# Patient Record
Sex: Male | Born: 1935 | Race: Black or African American | Hispanic: No | Marital: Married | Smoking: Former smoker
Health system: Southern US, Community
[De-identification: ages and names within clinical notes are randomized; demographics above are authoritative.]

## PROBLEM LIST (undated history)

## (undated) DIAGNOSIS — H18529 Epithelial (juvenile) corneal dystrophy, unspecified eye: Secondary | ICD-10-CM

## (undated) DIAGNOSIS — I10 Essential (primary) hypertension: Secondary | ICD-10-CM

## (undated) DIAGNOSIS — E119 Type 2 diabetes mellitus without complications: Secondary | ICD-10-CM

## (undated) DIAGNOSIS — K219 Gastro-esophageal reflux disease without esophagitis: Secondary | ICD-10-CM

## (undated) DIAGNOSIS — F419 Anxiety disorder, unspecified: Secondary | ICD-10-CM

---

## 2018-12-15 ENCOUNTER — Emergency Department (HOSPITAL_BASED_OUTPATIENT_CLINIC_OR_DEPARTMENT_OTHER): Payer: Medicare HMO

## 2018-12-15 ENCOUNTER — Other Ambulatory Visit: Payer: Self-pay

## 2018-12-15 ENCOUNTER — Encounter (HOSPITAL_BASED_OUTPATIENT_CLINIC_OR_DEPARTMENT_OTHER): Payer: Self-pay | Admitting: Adult Health

## 2018-12-15 ENCOUNTER — Emergency Department (HOSPITAL_BASED_OUTPATIENT_CLINIC_OR_DEPARTMENT_OTHER)
Admission: EM | Admit: 2018-12-15 | Discharge: 2018-12-15 | Disposition: A | Discharge status: Home / Self Care | Payer: Medicare HMO | Attending: Emergency Medicine | Admitting: Emergency Medicine

## 2018-12-15 DIAGNOSIS — E119 Type 2 diabetes mellitus without complications: Secondary | ICD-10-CM | POA: Diagnosis not present

## 2018-12-15 DIAGNOSIS — Z87891 Personal history of nicotine dependence: Secondary | ICD-10-CM | POA: Insufficient documentation

## 2018-12-15 DIAGNOSIS — I10 Essential (primary) hypertension: Secondary | ICD-10-CM | POA: Diagnosis not present

## 2018-12-15 DIAGNOSIS — M25551 Pain in right hip: Secondary | ICD-10-CM | POA: Diagnosis not present

## 2018-12-15 DIAGNOSIS — Z794 Long term (current) use of insulin: Secondary | ICD-10-CM | POA: Insufficient documentation

## 2018-12-15 DIAGNOSIS — Z79899 Other long term (current) drug therapy: Secondary | ICD-10-CM | POA: Diagnosis not present

## 2018-12-15 HISTORY — DX: Gastro-esophageal reflux disease without esophagitis: K21.9

## 2018-12-15 HISTORY — DX: Type 2 diabetes mellitus without complications: E11.9

## 2018-12-15 HISTORY — DX: Anxiety disorder, unspecified: F41.9

## 2018-12-15 HISTORY — DX: Essential (primary) hypertension: I10

## 2018-12-15 MED ORDER — HYDROCODONE-ACETAMINOPHEN 5-325 MG PO TABS: 1.0000 | ORAL_TABLET | ORAL | 0 refills | Status: DC | PRN

## 2018-12-15 NOTE — ED Triage Notes (Signed)
Johnny Meyer presents with 5 days of right hip pain. HE denies injury and fall. History of left hip arthritis and pain as well. THe pain does not radiate. Acetaminophen at 9 am today is not helping.

## 2018-12-15 NOTE — ED Notes (Signed)
ED Provider at bedside. 

## 2018-12-15 NOTE — ED Provider Notes (Signed)
[facility] EMERGENCY DEPARTMENT Provider Note   CSN: [id number] Arrival date & time: 12/15/18  1313     History   Chief Complaint Chief Complaint  Patient presents with  . Hip Pain    HPI Johnny Meyer is a 84 y.o. male.     Patient is a 84 year old male who presents with right hip pain.  He has a history of arthritis but says it typically bothers him in his left hip.  He has had a 5-day history of pain in his right hip.  He states it does not hurt when he is laying down but hurts when he is walking on it.  He denies any back pain.  No significant knee pain.  No numbness in his leg.  He denies any known injuries.  He has been using Tylenol without improvement in symptoms.     Past Medical History:  Diagnosis Date  . Anxiety   . Diabetes mellitus without complication (HCC)   . GERD (gastroesophageal reflux disease)   . Hypertension     There are no active problems to display for this patient.   History reviewed. No pertinent surgical history.      Home Medications    Prior to Admission medications   Medication Sig Start Date End Date Taking? Authorizing Provider  allopurinol (ZYLOPRIM) 300 MG tablet TAKE 1 TABLETEVERY NIGHT 12/12/18  Yes [provider]  amLODipine (NORVASC) 2.5 MG tablet Take by mouth. 12/03/18  Yes [provider]  aspirin EC 81 MG tablet Take by mouth. 06/17/13  Yes [provider]  atenolol-chlorthalidone (TENORETIC) 50-25 MG tablet Takes 1/2 tablet by mouth daily 01/21/14  Yes [provider]  atorvastatin (LIPITOR) 40 MG tablet TAKE 1/2 TABLET EVERY DAY 01/20/14  Yes [provider]  busPIRone (BUSPAR) 15 MG tablet Take by mouth. 02/15/18  Yes [provider]  Colchicine 0.6 MG CAPS Day 1: 1.2 mg p.o. x1 then 0.6 mg p.o. 1 hour later x1.  Day 2: 0.6 mg once or twice daily until flare resolves to a max of 3 days 03/26/18  Yes [provider]  DULoxetine (CYMBALTA) 20 MG capsule  2 capsules every am for chronic pain 10/24/18  Yes [provider]  fosinopril (MONOPRIL) 40 MG tablet TAKE 1/2 TABLET EVERY DAY 01/23/14  Yes [provider]  gabapentin (NEURONTIN) 600 MG tablet TAKE 1 TABLET THREE TIMES DAILY ( DOSE INCREASE ) 09/28/18  Yes [provider]  insulin glargine (LANTUS) 100 UNIT/ML injection Inject into the skin. 06/17/13  Yes [provider]  metFORMIN (GLUCOPHAGE) 500 MG tablet TAKE 1 TABLET TWICE DAILY WITH MEALS 12/14/17  Yes [provider]  metoprolol succinate (TOPROL-XL) 100 MG 24 hr tablet TAKE 1 TABLET EVERY DAY ( DOSE INCREASE ) 09/28/18  Yes [provider]  omeprazole (PRILOSEC) 40 MG capsule TAKE 1 CAPSULE EVERY MORNING 06/17/13  Yes [provider]  HYDROcodone-acetaminophen (NORCO/VICODIN) 5-325 MG tablet Take 1-2 tablets by mouth every 4 (four) hours as needed. 12/15/18   Rolan BuccoBelfi, Billie Intriago, MD    Family History History reviewed. No pertinent family history.  Social History Social History   Tobacco Use  . Smoking status: Former Games developermoker  . Smokeless tobacco: Never Used  Substance Use Topics  . Alcohol use: Never    Frequency: Never  . Drug use: Never     Allergies   Patient has no known allergies.   Review of Systems Review of Systems  Constitutional: Negative for fever.  Gastrointestinal: Negative for nausea and vomiting.  Musculoskeletal: Positive for arthralgias. Negative for back pain, joint swelling and neck pain.  Skin: Negative for wound.  Neurological: Negative for weakness, numbness and headaches.     Physical Exam Updated Vital Signs BP (!) 154/100   Pulse 71   Temp 98.2 F (36.8 C) (Oral)   Resp 18   SpO2 99%   Physical Exam Constitutional:      Appearance: He is well-developed.  HENT:     Head: Normocephalic and atraumatic.  Neck:     Musculoskeletal: Normal range of motion and neck supple.  Cardiovascular:     Rate and Rhythm: Normal rate.   Pulmonary:     Effort: Pulmonary effort is normal.  Musculoskeletal:        General: No tenderness.     Comments: Patient has no tenderness on palpation of his right hip,.  No pain on range of motion of the hip.  No pain over the lateral bursa.  No pain along the lumbar spine or lower back.  No pain over the SI joint.  There is no pain on palpation or range of motion of the right knee.  No significant swelling to the legs although he has some baseline swelling of both legs which he says is unchanged from baseline.  Pedal pulses are intact by Doppler.  He has normal color and temperature to the feet.  He has normal sensation and motor function distally.  Skin:    General: Skin is warm and dry.  Neurological:     Mental Status: He is alert and oriented to person, place, and time.      ED Treatments / Results  Labs (all labs ordered are listed, but only abnormal results are displayed) Labs Reviewed - No data to display  EKG    Radiology Dg Hip Unilat W Or Wo Pelvis 2-3 Views Right  Result Date: 12/15/2018 CLINICAL DATA:  Right hip pain.  No known injury. EXAM: DG HIP (WITH OR WITHOUT PELVIS) 2-3V RIGHT COMPARISON:  None. FINDINGS: There is no fracture or dislocation. There are slight arthritic changes of the right hip joint. Similar but slightly less prominent findings on the left. Prominent enthesophytes at the iliac spines as well as soft tissue calcifications adjacent to the greater trochanters bilaterally. Findings are suggestive of diffuse idiopathic skeletal hyperostosis(DISH). IMPRESSION: Slight arthritic changes of the right hip.  No acute abnormalities. Electronically Signed   By: Francene BoyersJames  Maxwell M.D.   On: 12/15/2018 14:06    Procedures Procedures (including critical care time)  Medications Ordered in ED Medications - No data to display   Initial Impression / Assessment and Plan / ED Course  I have reviewed the triage vital signs and the nursing notes.  Pertinent labs &  imaging results that were available during my care of the patient were reviewed by me and considered in my medical decision making (see chart for details).        X-ray shows some mild arthritic changes.  Otherwise unremarkable.  He is able to ambulate.  He has no significant pain on range of motion of the joint.  I feel this is likely a flareup of his arthritis.  There is no specific tenderness over the bursa.  He was discharged home in good condition.  He was given a short course of Vicodin which he has used in the past.  He will follow-up with his PCP next week if his symptoms are not improving.  Final Clinical  Impressions(s) / ED Diagnoses   Final diagnoses:  Right hip pain    ED Discharge Orders         Ordered    HYDROcodone-acetaminophen (NORCO/VICODIN) 5-325 MG tablet  Every 4 hours PRN     12/15/18 1529           Malvin Johns, MD 12/15/18 1532

## 2021-01-14 IMAGING — CR DG HIP (WITH OR WITHOUT PELVIS) 2-3V RIGHT
3 series · 3 of 3 positions shown · non-contrast
Comparison: None.

CLINICAL DATA: Right hip pain.  No known injury.

EXAM:
DG HIP (WITH OR WITHOUT PELVIS) 2-3V RIGHT

[t pelvis a.p.]
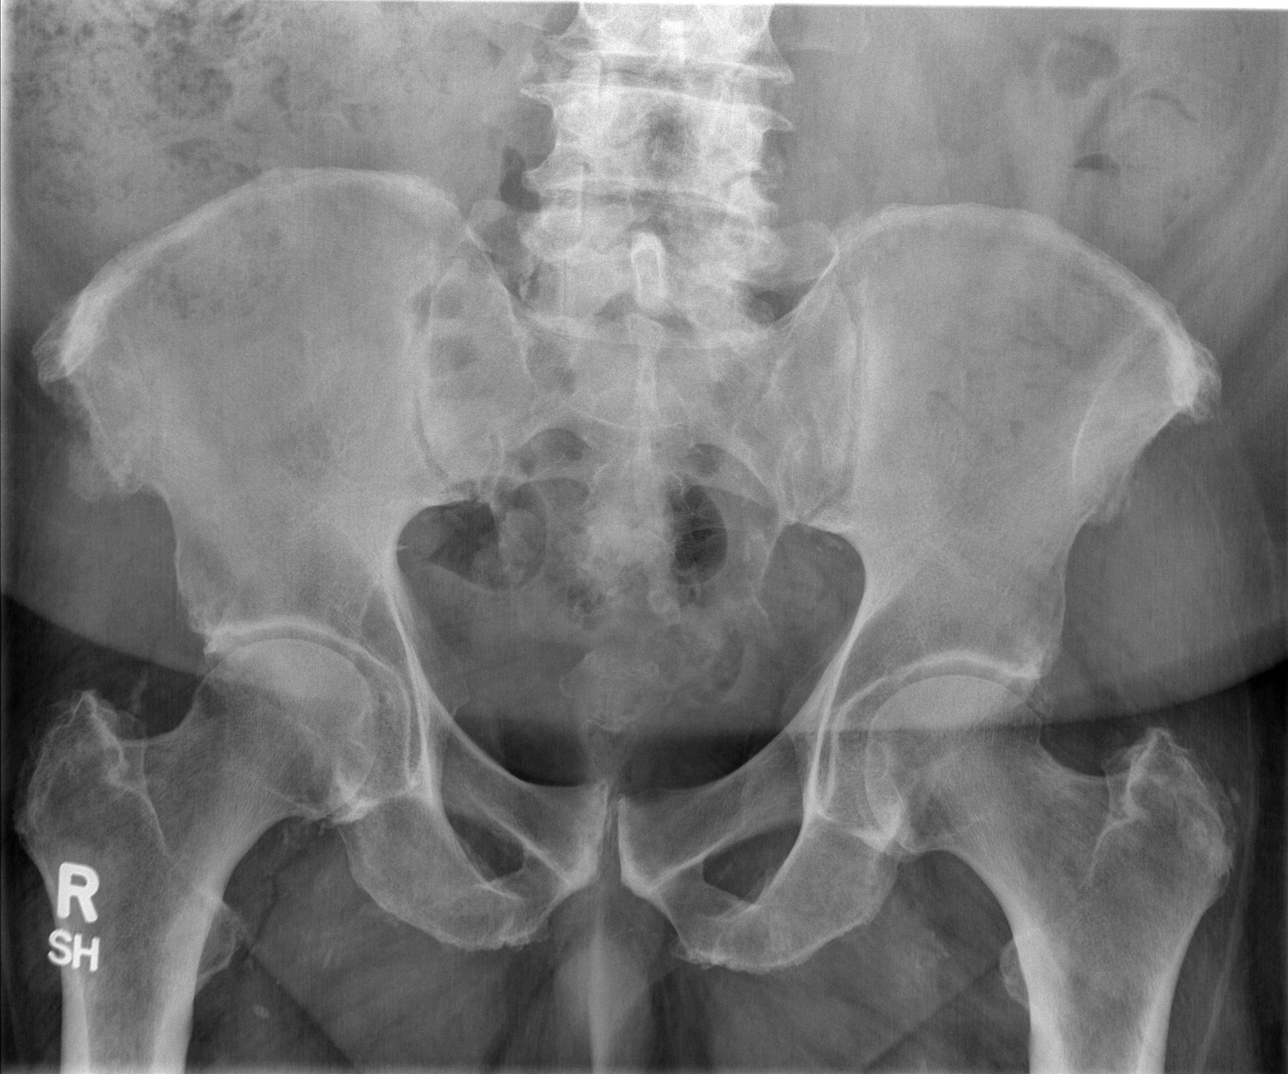

[t hip ap right]
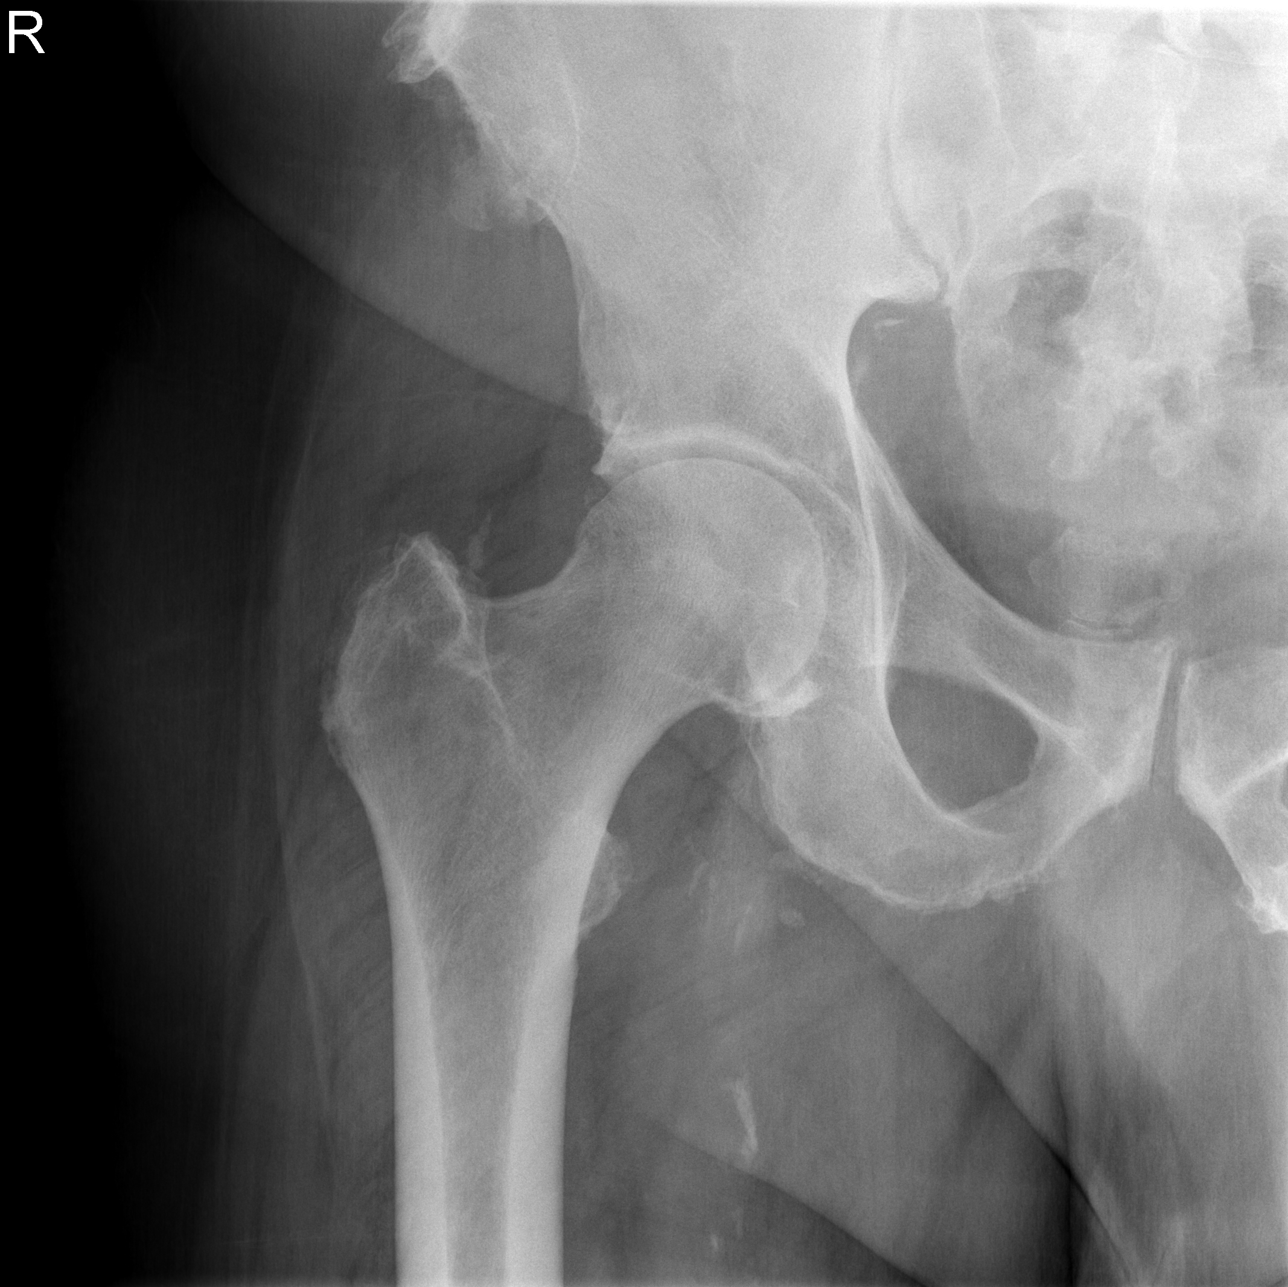

[t hip frog leg right]
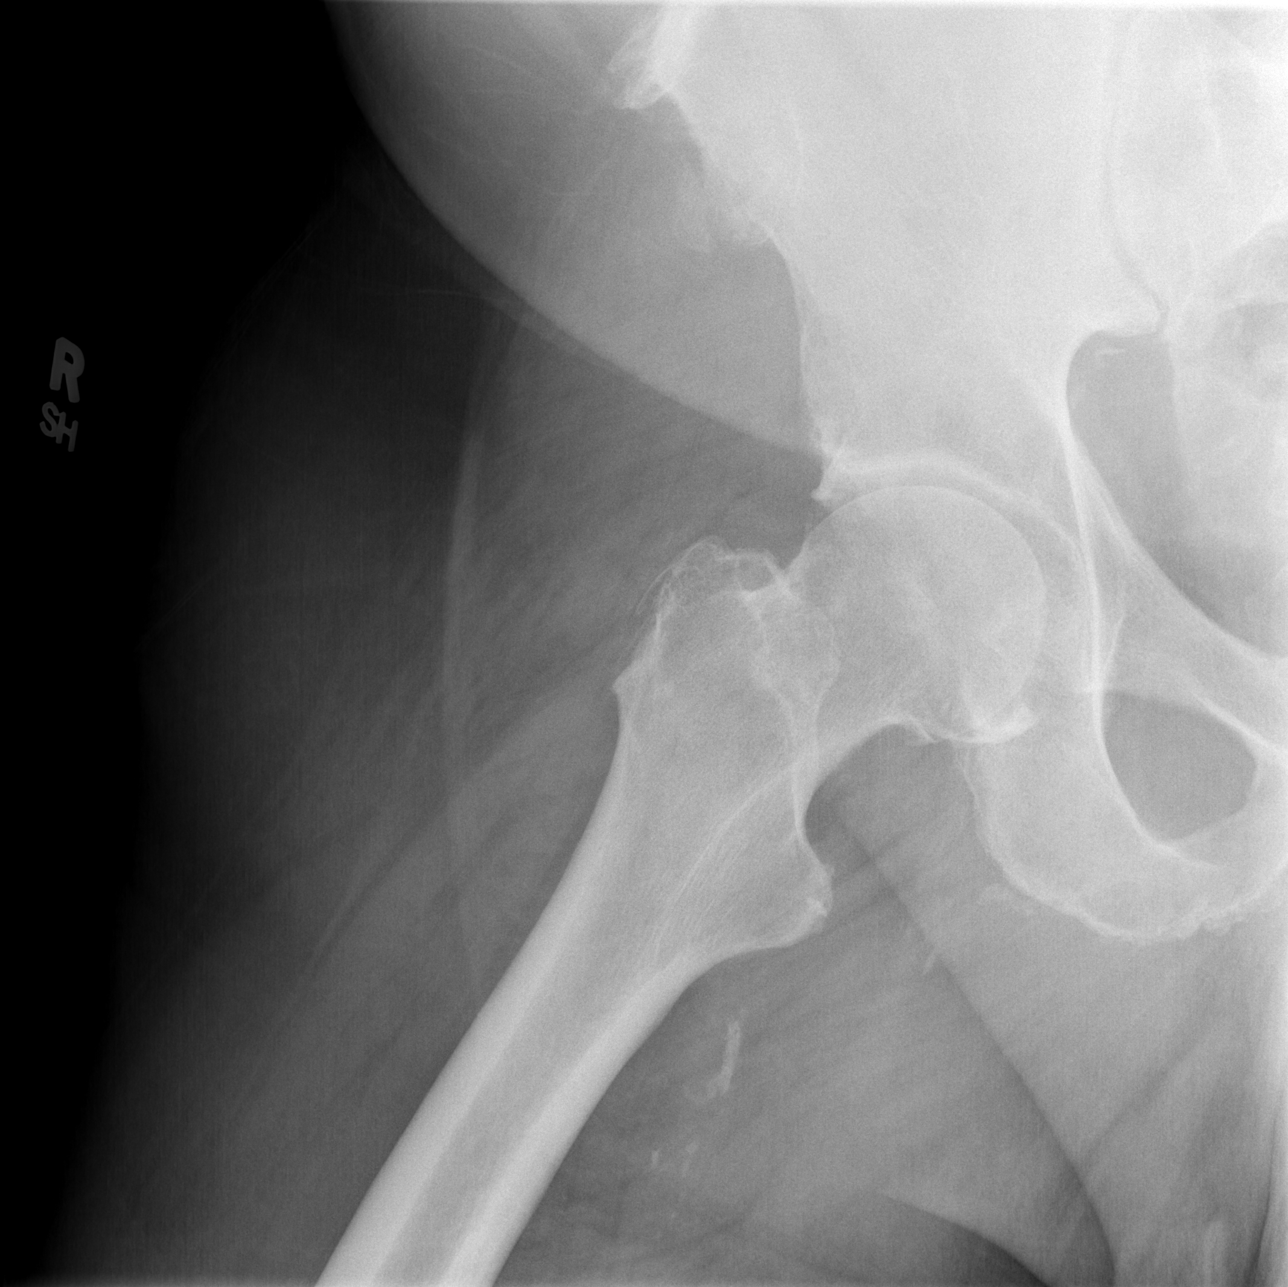

[3 of 3 positions shown; findings below may reference images not displayed]

FINDINGS: There is no fracture or dislocation. There are slight arthritic
changes of the right hip joint. Similar but slightly less prominent
findings on the left. Prominent enthesophytes at the iliac spines as
well as soft tissue calcifications adjacent to the greater
trochanters bilaterally. Findings are suggestive of diffuse
idiopathic skeletal hyperostosis(DISH).
IMPRESSION: Slight arthritic changes of the right hip.  No acute abnormalities.

## 2021-06-04 ENCOUNTER — Emergency Department (HOSPITAL_BASED_OUTPATIENT_CLINIC_OR_DEPARTMENT_OTHER): Payer: Medicare HMO

## 2021-06-04 ENCOUNTER — Emergency Department (HOSPITAL_COMMUNITY): Payer: Medicare HMO

## 2021-06-04 ENCOUNTER — Observation Stay (HOSPITAL_BASED_OUTPATIENT_CLINIC_OR_DEPARTMENT_OTHER)
Admission: EM | Admit: 2021-06-04 | Discharge: 2021-06-06 | Disposition: A | Discharge status: Home / Self Care | Payer: Medicare HMO | Attending: Emergency Medicine | Admitting: Emergency Medicine

## 2021-06-04 ENCOUNTER — Other Ambulatory Visit: Payer: Self-pay

## 2021-06-04 ENCOUNTER — Encounter (HOSPITAL_BASED_OUTPATIENT_CLINIC_OR_DEPARTMENT_OTHER): Payer: Self-pay

## 2021-06-04 DIAGNOSIS — E871 Hypo-osmolality and hyponatremia: Secondary | ICD-10-CM | POA: Insufficient documentation

## 2021-06-04 DIAGNOSIS — Z79899 Other long term (current) drug therapy: Secondary | ICD-10-CM | POA: Insufficient documentation

## 2021-06-04 DIAGNOSIS — Z7982 Long term (current) use of aspirin: Secondary | ICD-10-CM | POA: Insufficient documentation

## 2021-06-04 DIAGNOSIS — R0902 Hypoxemia: Secondary | ICD-10-CM

## 2021-06-04 DIAGNOSIS — Z87891 Personal history of nicotine dependence: Secondary | ICD-10-CM | POA: Diagnosis not present

## 2021-06-04 DIAGNOSIS — R4701 Aphasia: Principal | ICD-10-CM | POA: Diagnosis present

## 2021-06-04 DIAGNOSIS — Y900 Blood alcohol level of less than 20 mg/100 ml: Secondary | ICD-10-CM | POA: Diagnosis not present

## 2021-06-04 DIAGNOSIS — E119 Type 2 diabetes mellitus without complications: Secondary | ICD-10-CM

## 2021-06-04 DIAGNOSIS — D649 Anemia, unspecified: Secondary | ICD-10-CM | POA: Diagnosis not present

## 2021-06-04 DIAGNOSIS — R569 Unspecified convulsions: Secondary | ICD-10-CM

## 2021-06-04 DIAGNOSIS — Z794 Long term (current) use of insulin: Secondary | ICD-10-CM | POA: Diagnosis not present

## 2021-06-04 DIAGNOSIS — Z20822 Contact with and (suspected) exposure to covid-19: Secondary | ICD-10-CM | POA: Insufficient documentation

## 2021-06-04 DIAGNOSIS — I10 Essential (primary) hypertension: Secondary | ICD-10-CM | POA: Diagnosis present

## 2021-06-04 DIAGNOSIS — Z7984 Long term (current) use of oral hypoglycemic drugs: Secondary | ICD-10-CM | POA: Insufficient documentation

## 2021-06-04 DIAGNOSIS — E785 Hyperlipidemia, unspecified: Secondary | ICD-10-CM | POA: Diagnosis not present

## 2021-06-04 DIAGNOSIS — G9389 Other specified disorders of brain: Secondary | ICD-10-CM | POA: Insufficient documentation

## 2021-06-04 DIAGNOSIS — R2681 Unsteadiness on feet: Secondary | ICD-10-CM | POA: Diagnosis not present

## 2021-06-04 DIAGNOSIS — R4182 Altered mental status, unspecified: Secondary | ICD-10-CM | POA: Diagnosis present

## 2021-06-04 HISTORY — DX: Epithelial (juvenile) corneal dystrophy, unspecified eye: H18.529

## 2021-06-04 LAB — ETHANOL: Alcohol, Ethyl (B): <10 mg/dL (ref <10)

## 2021-06-04 LAB — COMPREHENSIVE METABOLIC PANEL
ALT: 11 U/L (ref 0–44)
AST: 15 U/L (ref 15–41)
Albumin: 3.6 g/dL (ref 3.5–5.0)
Alkaline Phosphatase: 92 U/L (ref 38–126)
Anion gap: 10 (ref 5–15)
BUN: 14 mg/dL (ref 8–23)
CO2: 21 mmol/L — ABNORMAL LOW (ref 22–32)
Calcium: 8.3 mg/dL — ABNORMAL LOW (ref 8.9–10.3)
Chloride: 103 mmol/L (ref 98–111)
Creatinine, Ser: 1.12 mg/dL (ref 0.61–1.24)
GFR, Estimated: >60 mL/min (ref >60)
Glucose, Bld: 217 mg/dL — ABNORMAL HIGH (ref 70–99)
Potassium: 4.4 mmol/L (ref 3.5–5.1)
Sodium: 134 mmol/L — ABNORMAL LOW (ref 135–145)
Total Bilirubin: 0.5 mg/dL (ref 0.3–1.2)
Total Protein: 6.9 g/dL (ref 6.5–8.1)

## 2021-06-04 LAB — CBC
HCT: 38 % — ABNORMAL LOW (ref 39.0–52.0)
Hemoglobin: 12.5 g/dL — ABNORMAL LOW (ref 13.0–17.0)
MCH: 29.3 pg (ref 26.0–34.0)
MCHC: 32.9 g/dL (ref 30.0–36.0)
MCV: 89.2 fL (ref 80.0–100.0)
Platelets: 200 10*3/uL (ref 150–400)
RBC: 4.26 MIL/uL (ref 4.22–5.81)
RDW: 14.6 % (ref 11.5–15.5)
WBC: 8.7 10*3/uL (ref 4.0–10.5)
nRBC: 0 % (ref 0.0–0.2)

## 2021-06-04 LAB — RESP PANEL BY RT-PCR (FLU A&B, COVID) ARPGX2
Influenza A by PCR: NEGATIVE
Influenza B by PCR: NEGATIVE
SARS Coronavirus 2 by RT PCR: NEGATIVE

## 2021-06-04 LAB — PROTIME-INR
INR: 1 (ref 0.8–1.2)
Prothrombin Time: 13.1 seconds (ref 11.4–15.2)

## 2021-06-04 LAB — DIFFERENTIAL
Abs Immature Granulocytes: 0.01 10*3/uL (ref 0.00–0.07)
Basophils Absolute: 0.1 10*3/uL (ref 0.0–0.1)
Basophils Relative: 1 %
Eosinophils Absolute: 0.1 10*3/uL (ref 0.0–0.5)
Eosinophils Relative: 1 %
Immature Granulocytes: 0 %
Lymphocytes Relative: 32 %
Lymphs Abs: 2.8 10*3/uL (ref 0.7–4.0)
Monocytes Absolute: 0.7 10*3/uL (ref 0.1–1.0)
Monocytes Relative: 9 %
Neutro Abs: 5 10*3/uL (ref 1.7–7.7)
Neutrophils Relative %: 57 %

## 2021-06-04 LAB — CBG MONITORING, ED: Glucose-Capillary: 213 mg/dL — ABNORMAL HIGH (ref 70–99)

## 2021-06-04 LAB — APTT: aPTT: 31 seconds (ref 24–36)

## 2021-06-04 MED ORDER — IOHEXOL 350 MG/ML SOLN
100.0000 mL | Freq: Once | INTRAVENOUS | Status: AC | PRN
  Administered 2021-06-04: 100 mL via INTRAVENOUS

## 2021-06-04 MED ORDER — LEVETIRACETAM IN NACL 1000 MG/100ML IV SOLN
1000.0000 mg | Freq: Once | INTRAVENOUS | Status: AC
  Administered 2021-06-04: 1000 mg via INTRAVENOUS
  Filled 2021-06-04: qty 100

## 2021-06-04 NOTE — ED Triage Notes (Addendum)
Per son pt with AMS and unable to complete sentences-sx started yesterday-unknown time-pt to tx area-steady gait with own gait-EDP at [facility]

## 2021-06-04 NOTE — ED Notes (Signed)
Pt ambulatory to room from triage with shuffling gait, pts son reports pt is supposed to use a walker and normally shuffles his feet while walking.

## 2021-06-04 NOTE — ED Notes (Signed)
[facility] at bedside 

## 2021-06-04 NOTE — ED Provider Notes (Signed)
[facility] EMERGENCY DEPARTMENT Provider Note   CSN: [id number] Arrival date & time: 06/04/21  1840     History Chief Complaint  Patient presents with   Altered Mental Status    Johnny Meyer is a 86 y.o. male.  Last known normal was 5 PM yesterday.  Patient has been having issues with talking.  He is not answering questions appropriately.  No obvious weakness per family.  Patient denies any chest pain, headache.  The history is provided by the patient and a caregiver.  Neurologic Problem This is a new problem. The current episode started yesterday. The problem occurs constantly. The problem has not changed since onset.Pertinent negatives include no chest pain, no abdominal pain, no headaches and no shortness of breath. Nothing aggravates the symptoms. Nothing relieves the symptoms. He has tried nothing for the symptoms. The treatment provided no relief.      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   No past surgical history on file.     No family history on file.  Social History   Tobacco Use   Smoking status: Former   Smokeless tobacco: Never  Building services engineer Use: Never used  Substance Use Topics   Alcohol use: Never   Drug use: Never    Home Medications Prior to Admission medications   Medication Sig Start Date End Date Taking? Authorizing Provider  allopurinol (ZYLOPRIM) 300 MG tablet TAKE 1 TABLETEVERY NIGHT 12/12/18   [provider]  amLODipine (NORVASC) 2.5 MG tablet Take by mouth. 12/03/18   [provider]  aspirin EC 81 MG tablet Take by mouth. 06/17/13   [provider]  atenolol-chlorthalidone (TENORETIC) 50-25 MG tablet Takes 1/2 tablet by mouth daily 01/21/14   [provider]  atorvastatin (LIPITOR) 40 MG tablet TAKE 1/2 TABLET EVERY DAY 01/20/14   [provider]  busPIRone (BUSPAR) 15 MG tablet Take by mouth. 02/15/18   [provider]  Colchicine 0.6 MG CAPS Day 1: 1.2 mg p.o. x1 then 0.6 mg p.o. 1 hour later x1.  Day 2: 0.6 mg once or twice daily until flare resolves to a max of 3 days 03/26/18   [provider]  DULoxetine (CYMBALTA) 20 MG capsule 2 capsules every am for chronic pain 10/24/18   [provider]  fosinopril (MONOPRIL) 40 MG tablet TAKE 1/2 TABLET EVERY DAY 01/23/14   [provider]  gabapentin (NEURONTIN) 600 MG tablet TAKE 1 TABLET THREE TIMES DAILY ( DOSE INCREASE ) 09/28/18   [provider]  HYDROcodone-acetaminophen (NORCO/VICODIN) 5-325 MG tablet Take 1-2 tablets by mouth every 4 (four) hours as needed. 12/15/18   Rolan Bucco, MD  insulin glargine (LANTUS) 100 UNIT/ML injection Inject into the skin. 06/17/13   [provider]  metFORMIN (GLUCOPHAGE) 500 MG tablet TAKE 1 TABLET TWICE DAILY WITH MEALS 12/14/17   [provider]  metoprolol succinate (TOPROL-XL) 100 MG 24 hr tablet TAKE 1 TABLET EVERY DAY ( DOSE INCREASE ) 09/28/18   [provider]  omeprazole (PRILOSEC) 40 MG capsule TAKE 1 CAPSULE EVERY MORNING 06/17/13   [provider]    Allergies    Patient has no known allergies.  Review of Systems  Review of Systems  Constitutional:  Negative for chills and fever.  HENT:  Negative for ear pain and sore throat.   Eyes:  Negative for pain and visual disturbance.  Respiratory:  Negative for cough and shortness of breath.   Cardiovascular:  Negative for chest pain and palpitations.  Gastrointestinal:  Negative for abdominal pain and vomiting.  Genitourinary:  Negative for dysuria and hematuria.  Musculoskeletal:  Negative for arthralgias and back pain.  Skin:  Negative for color change and rash.  Neurological:  Positive for speech difficulty.  Negative for dizziness, tremors, seizures, syncope, facial asymmetry, weakness, light-headedness, numbness and headaches.  All other systems reviewed and are negative.  Physical Exam Updated Vital Signs BP (!) 170/84    Pulse 64    Temp 97.9 F (36.6 C) (Oral)    Resp (!) 21    SpO2 96%   Physical Exam Vitals and nursing note reviewed.  Constitutional:      General: He is not in acute distress.    Appearance: He is well-developed. He is not ill-appearing.  HENT:     Head: Normocephalic and atraumatic.     Right Ear: Tympanic membrane normal.     Left Ear: Tympanic membrane normal.     Nose: Nose normal.     Mouth/Throat:     Mouth: Mucous membranes are moist.  Eyes:     Extraocular Movements: Extraocular movements intact.     Conjunctiva/sclera: Conjunctivae normal.     Pupils: Pupils are equal, round, and reactive to light.  Cardiovascular:     Rate and Rhythm: Normal rate and regular rhythm.     Pulses: Normal pulses.     Heart sounds: Normal heart sounds. No murmur heard. Pulmonary:     Effort: Pulmonary effort is normal. No respiratory distress.     Breath sounds: Normal breath sounds.  Abdominal:     Palpations: Abdomen is soft.     Tenderness: There is no abdominal tenderness.  Musculoskeletal:        General: No swelling.     Cervical back: Normal range of motion and neck supple.  Skin:    General: Skin is warm and dry.     Capillary Refill: Capillary refill takes less than 2 seconds.  Neurological:     Mental Status: He is alert.     Cranial Nerves: No cranial nerve deficit.     Sensory: No sensory deficit.     Motor: No weakness.     Coordination: Coordination normal.     Comments: Patient appears to have expressive aphasia, difficulty following commands but appears a 5+ out of 5 strength throughout, normal sensation, no obvious neglect, no slurred speech or facial droop, cranial nerves appear to be intact, normal finger-to-nose finger but needs to be well  coached, answers with inappropriate words when asking him to identify objects in the room  Psychiatric:        Mood and Affect: Mood normal.    ED Results / Procedures / Treatments   Labs (all labs ordered are listed, but only abnormal results are displayed) Labs Reviewed  CBC - Abnormal; Notable for the following components:      Result Value   Hemoglobin 12.5 (*)    HCT 38.0 (*)    All other components within normal limits  COMPREHENSIVE METABOLIC PANEL - Abnormal; Notable for the following components:   Sodium 134 (*)    CO2 21 (*)    Glucose, Bld 217 (*)  Calcium 8.3 (*)    All other components within normal limits  CBG MONITORING, ED - Abnormal; Notable for the following components:   Glucose-Capillary 213 (*)    All other components within normal limits  RESP PANEL BY RT-PCR (FLU A&B, COVID) ARPGX2  ETHANOL  PROTIME-INR  APTT  DIFFERENTIAL  RAPID URINE DRUG SCREEN, HOSP PERFORMED  URINALYSIS, ROUTINE W REFLEX MICROSCOPIC    EKG EKG Interpretation  Date/Time:  Friday June 04 2021 18:47:40 EST Ventricular Rate:  65 PR Interval:  168 QRS Duration: 115 QT Interval:  426 QTC Calculation: 443 R Axis:   20 Text Interpretation: Sinus rhythm Nonspecific intraventricular conduction delay Confirmed by Lennice Sites ([id number]) on 06/04/2021 6:59:04 PM  Radiology CT Angio Head W or Wo Contrast  Result Date: 06/04/2021 CLINICAL DATA:  History of AVM. Acute neuro deficit. Rule out stroke. Aphasia. EXAM: CT ANGIOGRAPHY HEAD AND NECK TECHNIQUE: Multidetector CT imaging of the head and neck was performed using the standard protocol during bolus administration of intravenous contrast. Multiplanar CT image reconstructions and MIPs were obtained to evaluate the vascular anatomy. Carotid stenosis measurements (when applicable) are obtained utilizing NASCET criteria, using the distal internal carotid diameter as the denominator. CONTRAST:  138mL OMNIPAQUE IOHEXOL 350 MG/ML SOLN  COMPARISON:  MRI head with contrast 12/09/2020 FINDINGS: CT HEAD FINDINGS Brain: Hypodensity lateral to the left frontal horn at the site of prior AVM. There is encephalomalacia in this area with a 5 mm calcification. No acute hemorrhage. Generalized atrophy. Negative for hydrocephalus. Negative for acute hemorrhage or mass. Vascular: Negative for hyperdense vessel Skull: Negative Sinuses: Paranasal sinuses clear. Orbits: No orbital lesion.  Right cataract extraction Review of the MIP images confirms the above findings CTA NECK FINDINGS Aortic arch: Standard branching. Imaged portion shows no evidence of aneurysm or dissection. No significant stenosis of the major arch vessel origins. Right carotid system: Minimal atherosclerotic disease right carotid bifurcation. Negative for stenosis. Left carotid system: Minimal atherosclerotic disease left carotid bifurcation. Negative for stenosis Vertebral arteries: Both vertebral arteries are widely patent to the skull base. Atherosclerotic calcification and severe stenosis of the vertebral artery at the foramen magnum bilaterally. Skeleton: Cervical spondylosis.  Multilevel spinal stenosis. Other neck: Negative Upper chest: Negative Review of the MIP images confirms the above findings CTA HEAD FINDINGS Anterior circulation: Atherosclerotic calcification cavernous carotid bilaterally without stenosis. Anterior cerebral arteries widely patent. Right middle cerebral artery widely patent Left M1 segment patent. There are multiple areas of severe stenosis involving the left MCA bifurcation and left M2 branches. No enhancing AVM. On the prior MRI, the enhancing AVM was present in the anterior sylvian fissure on the left. No enlarged draining vein. Posterior circulation: Severe calcific stenosis of both vertebral arteries at the foramen magnum. Both vertebral arteries are then patent to the basilar. Basilar is patent. Superior cerebellar and posterior cerebral arteries patent  bilaterally. Mild atherosclerotic disease in the posterior cerebral arteries bilaterally. No vascular malformation. Venous sinuses: Normal venous enhancement. Anatomic variants: None Review of the MIP images confirms the above findings IMPRESSION: 1. No significant carotid stenosis in the neck 2. 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 Previously noted AVM in the left frontal lobe in the anterior sylvian fissure does not show vascular enhancement. Question interval treatment with radiation. There is considerable vascular stenosis in the left MCA bifurcation and left MCA branches which could be due to radiation. 4. CT head demonstrates no acute hemorrhage. Encephalomalacia in the left  frontal lobe. Electronically Signed   By: Franchot Gallo M.D.   On: 06/04/2021 20:17   CT Angio Neck W and/or Wo Contrast  Result Date: 06/04/2021 CLINICAL DATA:  History of AVM. Acute neuro deficit. Rule out stroke. Aphasia. EXAM: CT ANGIOGRAPHY HEAD AND NECK TECHNIQUE: Multidetector CT imaging of the head and neck was performed using the standard protocol during bolus administration of intravenous contrast. Multiplanar CT image reconstructions and MIPs were obtained to evaluate the vascular anatomy. Carotid stenosis measurements (when applicable) are obtained utilizing NASCET criteria, using the distal internal carotid diameter as the denominator. CONTRAST:  174mL OMNIPAQUE IOHEXOL 350 MG/ML SOLN COMPARISON:  MRI head with contrast 12/09/2020 FINDINGS: CT HEAD FINDINGS Brain: Hypodensity lateral to the left frontal horn at the site of prior AVM. There is encephalomalacia in this area with a 5 mm calcification. No acute hemorrhage. Generalized atrophy. Negative for hydrocephalus. Negative for acute hemorrhage or mass. Vascular: Negative for hyperdense vessel Skull: Negative Sinuses: Paranasal sinuses clear. Orbits: No orbital lesion.  Right cataract extraction Review of  the MIP images confirms the above findings CTA NECK FINDINGS Aortic arch: Standard branching. Imaged portion shows no evidence of aneurysm or dissection. No significant stenosis of the major arch vessel origins. Right carotid system: Minimal atherosclerotic disease right carotid bifurcation. Negative for stenosis. Left carotid system: Minimal atherosclerotic disease left carotid bifurcation. Negative for stenosis Vertebral arteries: Both vertebral arteries are widely patent to the skull base. Atherosclerotic calcification and severe stenosis of the vertebral artery at the foramen magnum bilaterally. Skeleton: Cervical spondylosis.  Multilevel spinal stenosis. Other neck: Negative Upper chest: Negative Review of the MIP images confirms the above findings CTA HEAD FINDINGS Anterior circulation: Atherosclerotic calcification cavernous carotid bilaterally without stenosis. Anterior cerebral arteries widely patent. Right middle cerebral artery widely patent Left M1 segment patent. There are multiple areas of severe stenosis involving the left MCA bifurcation and left M2 branches. No enhancing AVM. On the prior MRI, the enhancing AVM was present in the anterior sylvian fissure on the left. No enlarged draining vein. Posterior circulation: Severe calcific stenosis of both vertebral arteries at the foramen magnum. Both vertebral arteries are then patent to the basilar. Basilar is patent. Superior cerebellar and posterior cerebral arteries patent bilaterally. Mild atherosclerotic disease in the posterior cerebral arteries bilaterally. No vascular malformation. Venous sinuses: Normal venous enhancement. Anatomic variants: None Review of the MIP images confirms the above findings IMPRESSION: 1. No significant carotid stenosis in the neck 2. 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 Previously noted AVM in the left frontal lobe in the anterior sylvian fissure does not  show vascular enhancement. Question interval treatment with radiation. There is considerable vascular stenosis in the left MCA bifurcation and left MCA branches which could be due to radiation. 4. CT head demonstrates no acute hemorrhage. Encephalomalacia in the left frontal lobe. Electronically Signed   By: Franchot Gallo M.D.   On: 06/04/2021 20:17    Procedures Procedures   Medications Ordered in ED Medications  iohexol (OMNIPAQUE) 350 MG/ML injection 100 mL (100 mLs Intravenous Contrast Given 06/04/21 1933)    ED Course  I have reviewed the triage vital signs and the nursing notes.  Pertinent labs & imaging results that were available during my care of the patient were reviewed by me and considered in my medical decision making (see chart for details).    MDM Rules/Calculators/A&P  Clee Comar is an 86 year old male with history of hypertension, diabetes who presents the ED with difficulty with speech.  Patient with overall unremarkable vitals.  No fever.  He is mildly hypertensive at 173/83.  Patient last seen normal at 5 PM yesterday.  Family noticed he is not making much sense when talking.  He appears to have normal strength and sensation on exam.  Overall slightly difficult for him to follow commands.  He has no obvious facial droop or slurred speech.  He has normal visual fields.  Normal cranial nerves.  He appears to have good finger-to-nose finger but slightly delayed likely secondary to confusion with commands.  He appears to have an expressive aphasia.  When I asked him to identify objects in the room he does not say a normal word.  Overall I am concerned for stroke.  We will get a head CT and basic labs.  Does not appear to have a history of stroke.  Family member not quite sure all of his medical problems or history.  He thought that he might have a history of AVMs in the past.  He is not on a blood thinner per chart review.  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  He is followed with neurosurgery at [facility].  Recent imaging has showed decreased size of this AVM.  CTA of the head and neck is overall unremarkable.  Lab work with no significant anemia, electrolyte ab, kidney injury.  Talked with Dr. Lelan Pons with neurology who is aware of the patient being transferred to [facility] for MRI.  He should be contacted after MRI is done.  Possible that these could be atypical seizures.  Dr. Tyrone Nine also aware with the ED staff.  This chart was dictated using voice recognition software.  Despite best efforts to proofread,  errors can occur which can change the documentation meaning.      Final Clinical Impression(s) / ED Diagnoses Final diagnoses:  Aphasia    Rx / DC Orders ED Discharge Orders     None        Lennice Sites, DO 06/04/21 2027

## 2021-06-04 NOTE — ED Notes (Signed)
After passing swallow eval, pt provided with water, chips and trail mix. Pt able to feed self and swallow without issue.

## 2021-06-04 NOTE — ED Notes (Signed)
Patient arrived from [facility].  No current complaints at this time.  Understands plan of care.

## 2021-06-04 NOTE — ED Notes (Signed)
Patient taken to MRI

## 2021-06-04 NOTE — ED Notes (Signed)
Pt transported to CT ?

## 2021-06-04 NOTE — ED Provider Notes (Signed)
Patient with a history of prior AVM presents with aphasia.  He was initially seen at [facility] and transferred here for MRI.  He had a CTA of the head and neck which showed no concerning findings.  MRI done tonight shows no acute abnormalities.  I spoke with the neurologist on-call, Dr. Scherrie November who recommends admission and EEG.  He is concerned that maybe this aphasia is resulting from seizure activity.  He does recommend loading the patient with Keppra and then starting 500 twice daily.  Will consult the hospitalist for admission.   Rolan Bucco, MD 06/04/21 [phone]

## 2021-06-05 ENCOUNTER — Encounter (HOSPITAL_COMMUNITY): Payer: Self-pay | Admitting: Family Medicine

## 2021-06-05 ENCOUNTER — Observation Stay (HOSPITAL_COMMUNITY): Payer: Medicare HMO

## 2021-06-05 DIAGNOSIS — Z794 Long term (current) use of insulin: Secondary | ICD-10-CM | POA: Diagnosis not present

## 2021-06-05 DIAGNOSIS — E119 Type 2 diabetes mellitus without complications: Secondary | ICD-10-CM

## 2021-06-05 DIAGNOSIS — Z87891 Personal history of nicotine dependence: Secondary | ICD-10-CM | POA: Diagnosis not present

## 2021-06-05 DIAGNOSIS — Z7982 Long term (current) use of aspirin: Secondary | ICD-10-CM | POA: Diagnosis not present

## 2021-06-05 DIAGNOSIS — R569 Unspecified convulsions: Secondary | ICD-10-CM | POA: Diagnosis not present

## 2021-06-05 DIAGNOSIS — R4701 Aphasia: Secondary | ICD-10-CM

## 2021-06-05 DIAGNOSIS — Y900 Blood alcohol level of less than 20 mg/100 ml: Secondary | ICD-10-CM | POA: Diagnosis not present

## 2021-06-05 DIAGNOSIS — I10 Essential (primary) hypertension: Secondary | ICD-10-CM | POA: Diagnosis not present

## 2021-06-05 DIAGNOSIS — Z7984 Long term (current) use of oral hypoglycemic drugs: Secondary | ICD-10-CM | POA: Diagnosis not present

## 2021-06-05 DIAGNOSIS — Z20822 Contact with and (suspected) exposure to covid-19: Secondary | ICD-10-CM | POA: Diagnosis not present

## 2021-06-05 DIAGNOSIS — Z79899 Other long term (current) drug therapy: Secondary | ICD-10-CM | POA: Diagnosis not present

## 2021-06-05 DIAGNOSIS — R4182 Altered mental status, unspecified: Secondary | ICD-10-CM | POA: Diagnosis present

## 2021-06-05 LAB — CBC
HCT: 38.6 % — ABNORMAL LOW (ref 39.0–52.0)
Hemoglobin: 12.5 g/dL — ABNORMAL LOW (ref 13.0–17.0)
MCH: 29.1 pg (ref 26.0–34.0)
MCHC: 32.4 g/dL (ref 30.0–36.0)
MCV: 89.8 fL (ref 80.0–100.0)
Platelets: 200 10*3/uL (ref 150–400)
RBC: 4.3 MIL/uL (ref 4.22–5.81)
RDW: 14.6 % (ref 11.5–15.5)
WBC: 8.1 10*3/uL (ref 4.0–10.5)
nRBC: 0 % (ref 0.0–0.2)

## 2021-06-05 LAB — URINALYSIS, ROUTINE W REFLEX MICROSCOPIC
Bilirubin Urine: NEGATIVE
Glucose, UA: 250 mg/dL — AB
Hgb urine dipstick: NEGATIVE
Ketones, ur: NEGATIVE mg/dL
Leukocytes,Ua: NEGATIVE
Nitrite: NEGATIVE
Protein, ur: NEGATIVE mg/dL
Specific Gravity, Urine: 1.01 (ref 1.005–1.030)
pH: 5.5 (ref 5.0–8.0)

## 2021-06-05 LAB — BASIC METABOLIC PANEL
Anion gap: 6 (ref 5–15)
BUN: 11 mg/dL (ref 8–23)
CO2: 24 mmol/L (ref 22–32)
Calcium: 8 mg/dL — ABNORMAL LOW (ref 8.9–10.3)
Chloride: 103 mmol/L (ref 98–111)
Creatinine, Ser: 1.06 mg/dL (ref 0.61–1.24)
GFR, Estimated: >60 mL/min (ref >60)
Glucose, Bld: 187 mg/dL — ABNORMAL HIGH (ref 70–99)
Potassium: 4.2 mmol/L (ref 3.5–5.1)
Sodium: 133 mmol/L — ABNORMAL LOW (ref 135–145)

## 2021-06-05 LAB — AMMONIA: Ammonia: 23 umol/L (ref 9–35)

## 2021-06-05 LAB — I-STAT VENOUS BLOOD GAS, ED
Acid-Base Excess: 2 mmol/L (ref 0.0–2.0)
Bicarbonate: 25.1 mmol/L (ref 20.0–28.0)
Calcium, Ion: 1.04 mmol/L — ABNORMAL LOW (ref 1.15–1.40)
HCT: 38 % — ABNORMAL LOW (ref 39.0–52.0)
Hemoglobin: 12.9 g/dL — ABNORMAL LOW (ref 13.0–17.0)
O2 Saturation: 97 %
Potassium: 3.7 mmol/L (ref 3.5–5.1)
Sodium: 139 mmol/L (ref 135–145)
TCO2: 26 mmol/L (ref 22–32)
pCO2, Ven: 34.4 mmHg — ABNORMAL LOW (ref 44.0–60.0)
pH, Ven: 7.47 — ABNORMAL HIGH (ref 7.250–7.430)
pO2, Ven: 87 mmHg — ABNORMAL HIGH (ref 32.0–45.0)

## 2021-06-05 LAB — RAPID URINE DRUG SCREEN, HOSP PERFORMED
Amphetamines: NOT DETECTED
Barbiturates: NOT DETECTED
Benzodiazepines: NOT DETECTED
Cocaine: NOT DETECTED
Opiates: NOT DETECTED
Tetrahydrocannabinol: NOT DETECTED

## 2021-06-05 LAB — TSH: TSH: 4.902 u[IU]/mL — ABNORMAL HIGH (ref 0.350–4.500)

## 2021-06-05 LAB — D-DIMER, QUANTITATIVE: D-Dimer, Quant: 1.01 ug/mL-FEU — ABNORMAL HIGH (ref 0.00–0.50)

## 2021-06-05 LAB — CBG MONITORING, ED
Glucose-Capillary: 200 mg/dL — ABNORMAL HIGH (ref 70–99)
Glucose-Capillary: 122 mg/dL — ABNORMAL HIGH (ref 70–99)
Glucose-Capillary: 148 mg/dL — ABNORMAL HIGH (ref 70–99)
Glucose-Capillary: 208 mg/dL — ABNORMAL HIGH (ref 70–99)

## 2021-06-05 LAB — VITAMIN B12: Vitamin B-12: 413 pg/mL (ref 180–914)

## 2021-06-05 LAB — FOLATE: Folate: 16.4 ng/mL (ref >5.9)

## 2021-06-05 MED ORDER — ACETAMINOPHEN 650 MG RE SUPP: 650.0000 mg | Freq: Four times a day (QID) | RECTAL | Status: DC | PRN

## 2021-06-05 MED ORDER — SENNOSIDES-DOCUSATE SODIUM 8.6-50 MG PO TABS: 1.0000 | ORAL_TABLET | Freq: Every evening | ORAL | Status: DC | PRN

## 2021-06-05 MED ORDER — SODIUM CHLORIDE 0.9% FLUSH
3.0000 mL | Freq: Two times a day (BID) | INTRAVENOUS | Status: DC
  Administered 2021-06-05 – 2021-06-06 (×3): 3 mL via INTRAVENOUS

## 2021-06-05 MED ORDER — VITAMIN B-12 1000 MCG PO TABS
1000.0000 ug | ORAL_TABLET | Freq: Every day | ORAL | Status: DC
Start: 2021-06-05 — End: 2021-06-06
  Administered 2021-06-05 – 2021-06-06 (×2): 1000 ug via ORAL
  Filled 2021-06-05 (×2): qty 1

## 2021-06-05 MED ORDER — ACETAMINOPHEN 325 MG PO TABS: 650.0000 mg | ORAL_TABLET | Freq: Four times a day (QID) | ORAL | Status: DC | PRN

## 2021-06-05 MED ORDER — ATORVASTATIN CALCIUM 10 MG PO TABS
20.0000 mg | ORAL_TABLET | Freq: Every day | ORAL | Status: DC
  Administered 2021-06-05 – 2021-06-06 (×2): 20 mg via ORAL
  Filled 2021-06-05 (×2): qty 2

## 2021-06-05 MED ORDER — PANTOPRAZOLE SODIUM 40 MG PO TBEC
40.0000 mg | DELAYED_RELEASE_TABLET | Freq: Every day | ORAL | Status: DC
  Administered 2021-06-05 – 2021-06-06 (×2): 40 mg via ORAL
  Filled 2021-06-05 (×2): qty 1

## 2021-06-05 MED ORDER — METOPROLOL SUCCINATE ER 25 MG PO TB24
50.0000 mg | ORAL_TABLET | Freq: Every day | ORAL | Status: DC
  Administered 2021-06-05 – 2021-06-06 (×2): 50 mg via ORAL
  Filled 2021-06-05 (×2): qty 2

## 2021-06-05 MED ORDER — ENOXAPARIN SODIUM 40 MG/0.4ML IJ SOSY
40.0000 mg | PREFILLED_SYRINGE | INTRAMUSCULAR | Status: DC
  Administered 2021-06-05 – 2021-06-06 (×2): 40 mg via SUBCUTANEOUS
  Filled 2021-06-05 (×2): qty 0.4

## 2021-06-05 MED ORDER — LEVETIRACETAM 500 MG PO TABS
500.0000 mg | ORAL_TABLET | Freq: Two times a day (BID) | ORAL | Status: DC
  Administered 2021-06-05 – 2021-06-06 (×3): 500 mg via ORAL
  Filled 2021-06-05 (×3): qty 1

## 2021-06-05 MED ORDER — SODIUM CHLORIDE 0.9 % IV SOLN: INTRAVENOUS | Status: AC

## 2021-06-05 MED ORDER — INSULIN ASPART 100 UNIT/ML IJ SOLN
0.0000 [IU] | Freq: Three times a day (TID) | INTRAMUSCULAR | Status: DC
  Administered 2021-06-05: 1 [IU] via SUBCUTANEOUS
  Administered 2021-06-05: 2 [IU] via SUBCUTANEOUS
  Administered 2021-06-05: 1 [IU] via SUBCUTANEOUS
  Administered 2021-06-06: 09:00:00 2 [IU] via SUBCUTANEOUS

## 2021-06-05 MED ORDER — INSULIN ASPART 100 UNIT/ML IJ SOLN
0.0000 [IU] | Freq: Every day | INTRAMUSCULAR | Status: DC
  Administered 2021-06-05: 2 [IU] via SUBCUTANEOUS

## 2021-06-05 NOTE — Procedures (Signed)
Patient Name: Johnny Meyer  MRN: [id number]  Epilepsy Attending: Charlsie Quest  Referring Physician/Provider: Dr Odie Sera Date: 06/05/2021 Duration: 25.57 mins  Patient history: 86 yo M with hx AVM s/p gamma knife, HTN, DM p/w aphasia. EEG to evaluate for seizure  Level of alertness: Awake, asleep  AEDs during EEG study: LEV  Technical aspects: This EEG study was done with scalp electrodes positioned according to the 10-20 International system of electrode placement. Electrical activity was acquired at a sampling rate of 500Hz  and reviewed with a high frequency filter of 70Hz  and a low frequency filter of 1Hz . EEG data were recorded continuously and digitally stored.   Description: The posterior dominant rhythm consists of 8 Hz activity of moderate voltage (25-35 uV) seen predominantly in posterior head regions, symmetric and reactive to eye opening and eye closing. Sleep was characterized by vertex waves, sleep spindles (12 to 14 Hz), maximal frontocentral region. Hyperventilation and photic stimulation were not performed.     EEG was technically difficult due to significant myogenic artifact and EKG artifact.  IMPRESSION: This technically difficult study is within normal limits. No seizures or epileptiform discharges were seen throughout the recording.  If suspicion for ictal-interictal activity remains a concern, a prolonged study should be considered.   [name] 

## 2021-06-05 NOTE — ED Notes (Signed)
Updated son and patient on care of care.  Son upset that patient does not currently have an admission room.  Attempted to explain process to son and offered comfort measures

## 2021-06-05 NOTE — ED Notes (Signed)
The pt  reports that he is going home tomorrow iv infusing at specified rate floor sticky around the trasg can in the room in front of the pts trash can ?? URINE

## 2021-06-05 NOTE — Progress Notes (Addendum)
PROGRESS NOTE    Johnny Meyer  RUE:[id number] DOB: [birth year] DOA: 06/04/2021 PCP: Lynn Ito, MD    Chief Complaint  Patient presents with   Altered Mental Status    Brief Narrative:  History of hypertension, hyperlipidemia, insulin-dependent type 2 diabetes, brain AVM status post gamma knife in 2016, presented to [facility] ED emergency room with speech difficulty/aphasia, transferred to [facility] for neurology consultation At baseline, he uses a cane or walker, has a shuffling gait at baseline per his son, and has an upper extremity tremor that is chronic  Head CT negative for acute hemorrhage, notable for encephalomalacia in the left frontal lobe, and CTA demonstrates severe stenosis of bilateral vertebral arteries and stenosis of the left MCA bifurcation and branches.  No acute intracranial abnormality noted on MRI brain.  Chemistry panel with glucose 217.  CBC with slight normocytic anemia.  COVID and influenza were negative.  Neurology was consulted by the ED physician, 1 g of IV Keppra was administered,  Subjective:  Still confused, still having trouble getting word out Son at bedside reports he has been like this since thursday  Currently he is very sleepy, snoring, hard to wake up, o2 dropped to 86% when he sleeps, he is on room air  Assessment & Plan:   Principal Problem:   Aphasia Active Problems:   Diabetes mellitus without complication (HCC)   Hypertension   Aphasia -History of AVM status post gamma knife -MRI no acute findings Keppra 1000 mg IV loaded in the ED -Neurology consulted, recommend continue Keppra 500 mg twice daily, EEG pending -Ammonia, folate, B12, UDS unremarkable, COVID screen/flu screen negative -TSH slightly elevated at 4.9, -UA no infection, no blood, no white cell, does have glucosuria urine glucose 250  Hypoxia,  Suspect sleep apnea Will get cxr, abg, ddimer Start o2 supplenment Will benefit from outpatient sleep study   Mild  hyponatremia Sodium 133 Start gentle hydration  Insulin-dependent type 2 diabetes On insulin and metformin at home A.m. fasting glucose 187 Add on A1c  Hypertension Currently on metoprolol XL, prn hydralazine Appear also on Norvasc, atenolol chlorthalidone.  Family will bring meds list to verify home meds  Hyperlipidemia Continue Lipitor  History of gout Continue allopurinol  Mild normocytic anemia Hemoglobin 12.5 Follow-up with PCP    Unresulted Labs (From admission, onward)     Start     Ordered   06/12/21 0500  Creatinine, serum  (enoxaparin (LOVENOX)    CrCl >/= 30 ml/min)  Weekly,   R     Comments: while on enoxaparin therapy    06/05/21 0208              DVT prophylaxis: enoxaparin (LOVENOX) injection 40 mg Start: 06/05/21 1000   Code Status:full Family Communication: son at bedside  Disposition:   Status is: Observation  Dispo: The patient is from: home, lives with wife, walks with a cane/walker              Anticipated d/c is to: home, likely will need home health              Anticipated d/c date is: 24-48hrs, continue to be confused and lethargic, need to monitor hypoxia as well                Consultants:  neurology  Procedures:  EEG  Antimicrobials:   Anti-infectives (From admission, onward)    None            Objective: Vitals:   06/04/21  2342 06/05/21 0355 06/05/21 0400 06/05/21 0600  BP: (!) 170/86 (!) 158/59 (!) 153/67 (!) 171/81  Pulse: 64 (!) 55  (!) 57  Resp: Temp:      TempSrc:      SpO2: 100% 99% 99% 100%    Intake/Output Summary (Last 24 hours) at 06/05/2021 0813 Last data filed at 06/05/2021 0021 Gross per 24 hour  Intake 100 ml  Output --  Net 100 ml   There were no vitals filed for this visit.  Examination:  General exam: lethargic, open eyes briefly upon sternal rub,  Respiratory system: Clear to auscultation. Snoring intermittently, does not appear in acute distress Cardiovascular  system:  RRR.  Gastrointestinal system: Abdomen is nondistended, soft and nontender.  Normal bowel sounds heard. Central nervous system: lethargy Extremities:  no edema Skin: No rashes, lesions or ulcers Psychiatry: lethargy .     Data Reviewed: I have personally reviewed following labs and imaging studies  CBC: Recent Labs  Lab 06/04/21 1858 06/05/21 0341  WBC 8.7 8.1  NEUTROABS 5.0  --   HGB 12.5* 12.5*  HCT 38.0* 38.6*  MCV 89.2 89.8  PLT 200 200    Basic Metabolic Panel: Recent Labs  Lab 06/04/21 1858 06/05/21 0341  NA 134* 133*  K 4.4 4.2  CL 103 103  CO2 21* 24  GLUCOSE 217* 187*  BUN 14 11  CREATININE 1.12 1.06  CALCIUM 8.3* 8.0*    GFR: CrCl cannot be calculated (Unknown ideal weight.).  Liver Function Tests: Recent Labs  Lab 06/04/21 1858  AST 15  ALT 11  ALKPHOS 92  BILITOT 0.5  PROT 6.9  ALBUMIN 3.6    CBG: Recent Labs  Lab 06/04/21 1847  GLUCAP 213*     Recent Results (from the past 240 hour(s))  Resp Panel by RT-PCR (Flu A&B, Covid) Nasopharyngeal Swab     Status: None   Collection Time: 06/04/21  7:50 PM   Specimen: Nasopharyngeal Swab; Nasopharyngeal(NP) swabs in vial transport medium  Result Value Ref Range Status   SARS Coronavirus 2 by RT PCR NEGATIVE NEGATIVE Final    Comment: (NOTE) SARS-CoV-2 target nucleic acids are NOT DETECTED.  The SARS-CoV-2 RNA is generally detectable in upper respiratory specimens during the acute phase of infection. The lowest concentration of SARS-CoV-2 viral copies this assay can detect is 138 copies/mL. A negative result does not preclude SARS-Cov-2 infection and should not be used as the sole basis for treatment or other patient management decisions. A negative result may occur with  improper specimen collection/handling, submission of specimen other than nasopharyngeal swab, presence of viral mutation(s) within the areas targeted by this assay, and inadequate number of viral copies(<138  copies/mL). A negative result must be combined with clinical observations, patient history, and epidemiological information. The expected result is Negative.  Fact Sheet for Patients:  BloggerCourse.com  Fact Sheet for Healthcare Providers:  SeriousBroker.it  This test is no t yet approved or cleared by the [place] FDA and  has been authorized for detection and/or diagnosis of SARS-CoV-2 by FDA under an Emergency Use Authorization (EUA). This EUA will remain  in effect (meaning this test can be used) for the duration of the COVID-19 declaration under Section 564(b)(1) of the Act, 21 U.S.C.section 360bbb-3(b)(1), unless the authorization is terminated  or revoked sooner.       Influenza A by PCR NEGATIVE NEGATIVE Final   Influenza B by PCR NEGATIVE NEGATIVE Final    Comment: (  NOTE) The Xpert Xpress SARS-CoV-2/FLU/RSV plus assay is intended as an aid in the diagnosis of influenza from Nasopharyngeal swab specimens and should not be used as a sole basis for treatment. Nasal washings and aspirates are unacceptable for Xpert Xpress SARS-CoV-2/FLU/RSV testing.  Fact Sheet for Patients: BloggerCourse.com  Fact Sheet for Healthcare Providers: SeriousBroker.it  This test is not yet approved or cleared by the [place] FDA and has been authorized for detection and/or diagnosis of SARS-CoV-2 by FDA under an Emergency Use Authorization (EUA). This EUA will remain in effect (meaning this test can be used) for the duration of the COVID-19 declaration under Section 564(b)(1) of the Act, 21 U.S.C. section 360bbb-3(b)(1), unless the authorization is terminated or revoked.  Performed at [facility], [address], [place], [place] [address]          Radiology Studies: CT Angio Head W or Wo Contrast  Result Date: 06/04/2021 CLINICAL DATA:  History of AVM. Acute  neuro deficit. Rule out stroke. Aphasia. EXAM: CT ANGIOGRAPHY HEAD AND NECK TECHNIQUE: Multidetector CT imaging of the head and neck was performed using the standard protocol during bolus administration of intravenous contrast. Multiplanar CT image reconstructions and MIPs were obtained to evaluate the vascular anatomy. Carotid stenosis measurements (when applicable) are obtained utilizing NASCET criteria, using the distal internal carotid diameter as the denominator. CONTRAST:  OMNIPAQUE IOHEXOL 350 MG/ML SOLN COMPARISON:  MRI head with contrast 12/09/2020 FINDINGS: CT HEAD FINDINGS Brain: Hypodensity lateral to the left frontal horn at the site of prior AVM. There is encephalomalacia in this area with a 5 mm calcification. No acute hemorrhage. Generalized atrophy. Negative for hydrocephalus. Negative for acute hemorrhage or mass. Vascular: Negative for hyperdense vessel Skull: Negative Sinuses: Paranasal sinuses clear. Orbits: No orbital lesion.  Right cataract extraction Review of the MIP images confirms the above findings CTA NECK FINDINGS Aortic arch: Standard branching. Imaged portion shows no evidence of aneurysm or dissection. No significant stenosis of the major arch vessel origins. Right carotid system: Minimal atherosclerotic disease right carotid bifurcation. Negative for stenosis. Left carotid system: Minimal atherosclerotic disease left carotid bifurcation. Negative for stenosis Vertebral arteries: Both vertebral arteries are widely patent to the skull base. Atherosclerotic calcification and severe stenosis of the vertebral artery at the foramen magnum bilaterally. Skeleton: Cervical spondylosis.  Multilevel spinal stenosis. Other neck: Negative Upper chest: Negative Review of the MIP images confirms the above findings CTA HEAD FINDINGS Anterior circulation: Atherosclerotic calcification cavernous carotid bilaterally without stenosis. Anterior cerebral arteries widely patent. Right middle  cerebral artery widely patent Left M1 segment patent. There are multiple areas of severe stenosis involving the left MCA bifurcation and left M2 branches. No enhancing AVM. On the prior MRI, the enhancing AVM was present in the anterior sylvian fissure on the left. No enlarged draining vein. Posterior circulation: Severe calcific stenosis of both vertebral arteries at the foramen magnum. Both vertebral arteries are then patent to the basilar. Basilar is patent. Superior cerebellar and posterior cerebral arteries patent bilaterally. Mild atherosclerotic disease in the posterior cerebral arteries bilaterally. No vascular malformation. Venous sinuses: Normal venous enhancement. Anatomic variants: None Review of the MIP images confirms the above findings IMPRESSION: 1. No significant carotid stenosis in the neck 2. 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 Previously noted AVM in the left frontal lobe in the anterior sylvian fissure does not show vascular enhancement. Question interval treatment with radiation. There is considerable vascular stenosis  in the left MCA bifurcation and left MCA branches which could be due to radiation. 4. CT head demonstrates no acute hemorrhage. Encephalomalacia in the left frontal lobe. Electronically Signed   By: Marlan Palau M.D.   On: 06/04/2021 20:17   CT Angio Neck W and/or Wo Contrast  Result Date: 06/04/2021 CLINICAL DATA:  History of AVM. Acute neuro deficit. Rule out stroke. Aphasia. EXAM: CT ANGIOGRAPHY HEAD AND NECK TECHNIQUE: Multidetector CT imaging of the head and neck was performed using the standard protocol during bolus administration of intravenous contrast. Multiplanar CT image reconstructions and MIPs were obtained to evaluate the vascular anatomy. Carotid stenosis measurements (when applicable) are obtained utilizing NASCET criteria, using the distal internal carotid diameter as the denominator.  CONTRAST:  OMNIPAQUE IOHEXOL 350 MG/ML SOLN COMPARISON:  MRI head with contrast 12/09/2020 FINDINGS: CT HEAD FINDINGS Brain: Hypodensity lateral to the left frontal horn at the site of prior AVM. There is encephalomalacia in this area with a 5 mm calcification. No acute hemorrhage. Generalized atrophy. Negative for hydrocephalus. Negative for acute hemorrhage or mass. Vascular: Negative for hyperdense vessel Skull: Negative Sinuses: Paranasal sinuses clear. Orbits: No orbital lesion.  Right cataract extraction Review of the MIP images confirms the above findings CTA NECK FINDINGS Aortic arch: Standard branching. Imaged portion shows no evidence of aneurysm or dissection. No significant stenosis of the major arch vessel origins. Right carotid system: Minimal atherosclerotic disease right carotid bifurcation. Negative for stenosis. Left carotid system: Minimal atherosclerotic disease left carotid bifurcation. Negative for stenosis Vertebral arteries: Both vertebral arteries are widely patent to the skull base. Atherosclerotic calcification and severe stenosis of the vertebral artery at the foramen magnum bilaterally. Skeleton: Cervical spondylosis.  Multilevel spinal stenosis. Other neck: Negative Upper chest: Negative Review of the MIP images confirms the above findings CTA HEAD FINDINGS Anterior circulation: Atherosclerotic calcification cavernous carotid bilaterally without stenosis. Anterior cerebral arteries widely patent. Right middle cerebral artery widely patent Left M1 segment patent. There are multiple areas of severe stenosis involving the left MCA bifurcation and left M2 branches. No enhancing AVM. On the prior MRI, the enhancing AVM was present in the anterior sylvian fissure on the left. No enlarged draining vein. Posterior circulation: Severe calcific stenosis of both vertebral arteries at the foramen magnum. Both vertebral arteries are then patent to the basilar. Basilar is patent. Superior  cerebellar and posterior cerebral arteries patent bilaterally. Mild atherosclerotic disease in the posterior cerebral arteries bilaterally. No vascular malformation. Venous sinuses: Normal venous enhancement. Anatomic variants: None Review of the MIP images confirms the above findings IMPRESSION: 1. No significant carotid stenosis in the neck 2. 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 Previously noted AVM in the left frontal lobe in the anterior sylvian fissure does not show vascular enhancement. Question interval treatment with radiation. There is considerable vascular stenosis in the left MCA bifurcation and left MCA branches which could be due to radiation. 4. CT head demonstrates no acute hemorrhage. Encephalomalacia in the left frontal lobe. Electronically Signed   By: Marlan Palau M.D.   On: 06/04/2021 20:17   MR BRAIN WO CONTRAST  Result Date: 06/04/2021 CLINICAL DATA:  Stroke suspected, difficulty talking EXAM: MRI HEAD WITHOUT CONTRAST TECHNIQUE: Multiplanar, multiecho pulse sequences of the brain and surrounding structures were obtained without intravenous contrast. COMPARISON:  12/09/2020, 11/29/2019 FINDINGS: Brain: No restricted diffusion to suggest acute or subacute infarction. No acute hemorrhage, mass, mass effect, or midline shift. No hydrocephalus  or extra-axial collection. T2 hyperintense signal in the periventricular white matter, likely the sequela of chronic small vessel ischemic disease. 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 No other foci of hemosiderin deposition to suggest remote hemorrhage. Vascular: Normal flow voids. Skull and upper cervical spine: Normal marrow signal.  Sinuses/Orbits: Negative.  Status post right lens replacement. Other: None. IMPRESSION: 1. No acute intracranial process. 2. Redemonstrated noncontrast findings related to a known left insular AVM, which is incompletely evaluated in the absence of contrast; however, the noncontrast appearance is stable. 1. No acute intracranial process. Electronically Signed   By: Wiliam Ke M.D.   On: 06/04/2021 23:04        Scheduled Meds:  atorvastatin  20 mg Oral Daily   enoxaparin (LOVENOX) injection  40 mg Subcutaneous Q24H   insulin aspart  0-5 Units Subcutaneous QHS   insulin aspart  0-9 Units Subcutaneous TID WC   levETIRAcetam  500 mg Oral BID   metoprolol succinate  50 mg Oral Daily   pantoprazole  40 mg Oral Daily   sodium chloride flush  3 mL Intravenous Q12H   Continuous Infusions:   LOS: 0 days   Time spent: Greater than 50% of this time was spent in counseling, explanation of diagnosis, planning of further management, and coordination of care.   Voice Recognition [employer] [place] dictation system was used to create this note, attempts have been made to correct errors. Please contact the author with questions and/or clarifications.   Albertine Grates, MD PhD FACP [facility]  Available via Epic secure chat 7am-7pm for nonurgent issues Please page for urgent issues To page the attending provider between 7A-7P or the covering provider during after hours 7P-7A, please log into the web site [email or web address] and access using universal [place] password for that web site. If you do not have the password, please call the hospital operator.    06/05/2021, 8:13 AM

## 2021-06-05 NOTE — H&P (Signed)
History and Physical    Johnny Meyer ZOX:[id number] DOB: 28-Jul-1934 DOA: 06/04/2021  PCP: Lynn Ito, MD   Patient coming from: Home   Chief Complaint: Speech problem   HPI: Johnny Meyer is a pleasant 86 y.o. 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  He is accompanied by his son who assists with the history.  The patient lives with his wife who noticed that he was having trouble speaking yesterday.  Son describes this as patient having difficulty getting words out.  Patient has not made any complaints.  He appeared to be more fatigued lately but is easy to wake.  He uses a cane or walker, has a shuffling gait at baseline per his son, and has an upper extremity tremor that is chronic.  ED Course: Upon arrival to the ED, patient is found to be afebrile, saturating well on room air, and with blood pressure 166/88.  EKG features sinus rhythm.  Head CT negative for acute hemorrhage, notable for encephalomalacia in the left frontal lobe, and CTA demonstrates severe stenosis of bilateral vertebral arteries and stenosis of the left MCA bifurcation and branches.  No acute intracranial abnormality noted on MRI brain.  Chemistry panel with glucose 217.  CBC with slight normocytic anemia.  COVID and influenza were negative.  Neurology was consulted by the ED physician, 1 g of IV Keppra was administered, and hospitalists called for admission.  Review of Systems:  All other systems reviewed and apart from HPI, are negative.  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 No pertinent surgical history.  Social History:   reports that he has quit smoking.  He has never used smokeless tobacco. He reports that he does not drink alcohol and does not use drugs.  No Known Allergies  History reviewed. No pertinent family history.   Prior to Admission medications   Medication Sig Start Date End Date Taking? Authorizing Provider  allopurinol (ZYLOPRIM) 300 MG tablet TAKE 1 TABLETEVERY NIGHT 12/12/18   [provider]  amLODipine (NORVASC) 2.5 MG tablet Take by mouth. 12/03/18   [provider]  aspirin EC 81 MG tablet Take by mouth. 06/17/13   [provider]  atenolol-chlorthalidone (TENORETIC) 50-25 MG tablet Takes 1/2 tablet by mouth daily 01/21/14   [provider]  atorvastatin (LIPITOR) 40 MG tablet TAKE 1/2 TABLET EVERY DAY 01/20/14   [provider]  busPIRone (BUSPAR) 15 MG tablet Take by mouth. 02/15/18   [provider]  Colchicine 0.6 MG CAPS Day 1: 1.2 mg p.o. x1 then 0.6 mg p.o. 1 hour later x1.  Day 2: 0.6 mg once or twice daily until flare resolves to a max of 3 days 03/26/18   [provider]  DULoxetine (CYMBALTA) 20 MG capsule 2 capsules every am for chronic pain 10/24/18   [provider]  fosinopril (MONOPRIL) 40 MG tablet TAKE 1/2 TABLET EVERY DAY 01/23/14   [provider]  gabapentin (NEURONTIN) 600 MG tablet TAKE 1 TABLET THREE TIMES DAILY ( DOSE INCREASE ) 09/28/18   [provider]  HYDROcodone-acetaminophen (NORCO/VICODIN) 5-325 MG tablet Take 1-2 tablets by mouth every 4 (four) hours as  needed. 12/15/18   Rolan Bucco, MD  insulin glargine (LANTUS) 100 UNIT/ML injection Inject into the skin. 06/17/13   [provider]  metFORMIN (GLUCOPHAGE) 500 MG tablet TAKE 1 TABLET TWICE DAILY WITH MEALS 12/14/17   [provider]  metoprolol succinate (TOPROL-XL) 100 MG 24 hr tablet TAKE 1 TABLET EVERY DAY ( DOSE INCREASE ) 09/28/18   [provider]  omeprazole (PRILOSEC) 40 MG capsule TAKE 1 CAPSULE EVERY MORNING 06/17/13    [provider]    Physical Exam: Vitals:   06/04/21 2030 06/04/21 2045 06/04/21 2221 06/04/21 2342  BP: (!) 168/93 (!) 166/77 (!) 166/88 (!) 170/86  Pulse: 65 (!) 59 66 64  Resp: 20 17 18 18   Temp:   98.1 F (36.7 C)   TempSrc:   Oral   SpO2: 100% 100% 100% 100%    Constitutional: NAD, calm  Eyes: PERTLA, lids and conjunctivae normal ENMT: Mucous membranes are moist. Posterior pharynx clear of any exudate or lesions.   Neck: supple, no masses  Respiratory: no wheezing, no crackles. No accessory muscle use.  Cardiovascular: S1 & S2 heard, regular rate and rhythm. No extremity edema.   Abdomen: No distension, no tenderness, soft. Bowel sounds active.  Musculoskeletal: no clubbing / cyanosis. No joint deformity upper and lower extremities.   Skin: no significant rashes, lesions, ulcers. Warm, dry, well-perfused. Neurologic: No gross facial asymmetry. Mild dysarthria, expressive aphasia. Resting RUE tremor. Strength 5/5 in all 4 limbs. Alert.   Psychiatric: Pleasant. Cooperative.    Labs and Imaging on Admission: I have personally reviewed following labs and imaging studies  CBC: Recent Labs  Lab 06/04/21 1858  WBC 8.7  NEUTROABS 5.0  HGB 12.5*  HCT 38.0*  MCV 89.2  PLT 200   Basic Metabolic Panel: Recent Labs  Lab 06/04/21 1858  NA 134*  K 4.4  CL 103  CO2 21*  GLUCOSE 217*  BUN 14  CREATININE 1.12  CALCIUM 8.3*   GFR: CrCl cannot be calculated (Unknown ideal weight.). Liver Function Tests: Recent Labs  Lab 06/04/21 1858  AST 15  ALT 11  ALKPHOS 92  BILITOT 0.5  PROT 6.9  ALBUMIN 3.6   No results for input(s): LIPASE, AMYLASE in the last 168 hours. No results for input(s): AMMONIA in the last 168 hours. Coagulation Profile: Recent Labs  Lab 06/04/21 1858  INR 1.0   Cardiac Enzymes: No results for input(s): CKTOTAL, CKMB, CKMBINDEX, TROPONINI in the last 168 hours. BNP (last 3 results) No results for input(s): PROBNP in the last 8760  hours. HbA1C: No results for input(s): HGBA1C in the last 72 hours. CBG: Recent Labs  Lab 06/04/21 1847  GLUCAP 213*   Lipid Profile: No results for input(s): CHOL, HDL, LDLCALC, TRIG, CHOLHDL, LDLDIRECT in the last 72 hours. Thyroid Function Tests: No results for input(s): TSH, T4TOTAL, FREET4, T3FREE, THYROIDAB in the last 72 hours. Anemia Panel: No results for input(s): VITAMINB12, FOLATE, FERRITIN, TIBC, IRON, RETICCTPCT in the last 72 hours. Urine analysis: No results found for: COLORURINE, APPEARANCEUR, LABSPEC, PHURINE, GLUCOSEU, HGBUR, BILIRUBINUR, KETONESUR, PROTEINUR, UROBILINOGEN, NITRITE, LEUKOCYTESUR Sepsis Labs: @LABRCNTIP (procalcitonin:4,lacticidven:4) ) Recent Results (from the past 240 hour(s))  Resp Panel by RT-PCR (Flu A&B, Covid) Nasopharyngeal Swab     Status: None   Collection Time: 06/04/21  7:50 PM   Specimen: Nasopharyngeal Swab; Nasopharyngeal(NP) swabs in vial transport medium  Result Value Ref Range Status   SARS Coronavirus 2 by RT PCR NEGATIVE NEGATIVE Final    Comment: (NOTE)  SARS-CoV-2 target nucleic acids are NOT DETECTED.  The SARS-CoV-2 RNA is generally detectable in upper respiratory specimens during the acute phase of infection. The lowest concentration of SARS-CoV-2 viral copies this assay can detect is 138 copies/mL. A negative result does not preclude SARS-Cov-2 infection and should not be used as the sole basis for treatment or other patient management decisions. A negative result may occur with  improper specimen collection/handling, submission of specimen other than nasopharyngeal swab, presence of viral mutation(s) within the areas targeted by this assay, and inadequate number of viral copies(<138 copies/mL). A negative result must be combined with clinical observations, patient history, and epidemiological information. The expected result is Negative.  Fact Sheet for Patients:  BloggerCourse.com  Fact  Sheet for Healthcare Providers:  SeriousBroker.it  This test is no t yet approved or cleared by the [place] FDA and  has been authorized for detection and/or diagnosis of SARS-CoV-2 by FDA under an Emergency Use Authorization (EUA). This EUA will remain  in effect (meaning this test can be used) for the duration of the COVID-19 declaration under Section 564(b)(1) of the Act, 21 U.S.C.section 360bbb-3(b)(1), unless the authorization is terminated  or revoked sooner.       Influenza A by PCR NEGATIVE NEGATIVE Final   Influenza B by PCR NEGATIVE NEGATIVE Final    Comment: (NOTE) The Xpert Xpress SARS-CoV-2/FLU/RSV plus assay is intended as an aid in the diagnosis of influenza from Nasopharyngeal swab specimens and should not be used as a sole basis for treatment. Nasal washings and aspirates are unacceptable for Xpert Xpress SARS-CoV-2/FLU/RSV testing.  Fact Sheet for Patients: BloggerCourse.com  Fact Sheet for Healthcare Providers: SeriousBroker.it  This test is not yet approved or cleared by the [place] FDA and has been authorized for detection and/or diagnosis of SARS-CoV-2 by FDA under an Emergency Use Authorization (EUA). This EUA will remain in effect (meaning this test can be used) for the duration of the COVID-19 declaration under Section 564(b)(1) of the Act, 21 U.S.C. section 360bbb-3(b)(1), unless the authorization is terminated or revoked.  Performed at [facility], [address] [place], [place] [address]      Radiological Exams on Admission: CT Angio Head W or Wo Contrast  Result Date: 06/04/2021 CLINICAL DATA:  History of AVM. Acute neuro deficit. Rule out stroke. Aphasia. EXAM: CT ANGIOGRAPHY HEAD AND NECK TECHNIQUE: Multidetector CT imaging of the head and neck was performed using the standard protocol during bolus administration of intravenous contrast.  Multiplanar CT image reconstructions and MIPs were obtained to evaluate the vascular anatomy. Carotid stenosis measurements (when applicable) are obtained utilizing NASCET criteria, using the distal internal carotid diameter as the denominator. CONTRAST:  OMNIPAQUE IOHEXOL 350 MG/ML SOLN COMPARISON:  MRI head with contrast 12/09/2020 FINDINGS: CT HEAD FINDINGS Brain: Hypodensity lateral to the left frontal horn at the site of prior AVM. There is encephalomalacia in this area with a 5 mm calcification. No acute hemorrhage. Generalized atrophy. Negative for hydrocephalus. Negative for acute hemorrhage or mass. Vascular: Negative for hyperdense vessel Skull: Negative Sinuses: Paranasal sinuses clear. Orbits: No orbital lesion.  Right cataract extraction Review of the MIP images confirms the above findings CTA NECK FINDINGS Aortic arch: Standard branching. Imaged portion shows no evidence of aneurysm or dissection. No significant stenosis of the major arch vessel origins. Right carotid system: Minimal atherosclerotic disease right carotid bifurcation. Negative for stenosis. Left carotid system: Minimal atherosclerotic disease left carotid bifurcation. Negative for stenosis Vertebral arteries: Both  vertebral arteries are widely patent to the skull base. Atherosclerotic calcification and severe stenosis of the vertebral artery at the foramen magnum bilaterally. Skeleton: Cervical spondylosis.  Multilevel spinal stenosis. Other neck: Negative Upper chest: Negative Review of the MIP images confirms the above findings CTA HEAD FINDINGS Anterior circulation: Atherosclerotic calcification cavernous carotid bilaterally without stenosis. Anterior cerebral arteries widely patent. Right middle cerebral artery widely patent Left M1 segment patent. There are multiple areas of severe stenosis involving the left MCA bifurcation and left M2 branches. No enhancing AVM. On the prior MRI, the enhancing AVM was present in the  anterior sylvian fissure on the left. No enlarged draining vein. Posterior circulation: Severe calcific stenosis of both vertebral arteries at the foramen magnum. Both vertebral arteries are then patent to the basilar. Basilar is patent. Superior cerebellar and posterior cerebral arteries patent bilaterally. Mild atherosclerotic disease in the posterior cerebral arteries bilaterally. No vascular malformation. Venous sinuses: Normal venous enhancement. Anatomic variants: None Review of the MIP images confirms the above findings IMPRESSION: 1. No significant carotid stenosis in the neck 2. 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 Previously noted AVM in the left frontal lobe in the anterior sylvian fissure does not show vascular enhancement. Question interval treatment with radiation. There is considerable vascular stenosis in the left MCA bifurcation and left MCA branches which could be due to radiation. 4. CT head demonstrates no acute hemorrhage. Encephalomalacia in the left frontal lobe. Electronically Signed   By: Marlan Palau M.D.   On: 06/04/2021 20:17   CT Angio Neck W and/or Wo Contrast  Result Date: 06/04/2021 CLINICAL DATA:  History of AVM. Acute neuro deficit. Rule out stroke. Aphasia. EXAM: CT ANGIOGRAPHY HEAD AND NECK TECHNIQUE: Multidetector CT imaging of the head and neck was performed using the standard protocol during bolus administration of intravenous contrast. Multiplanar CT image reconstructions and MIPs were obtained to evaluate the vascular anatomy. Carotid stenosis measurements (when applicable) are obtained utilizing NASCET criteria, using the distal internal carotid diameter as the denominator. CONTRAST:  OMNIPAQUE IOHEXOL 350 MG/ML SOLN COMPARISON:  MRI head with contrast 12/09/2020 FINDINGS: CT HEAD FINDINGS Brain: Hypodensity lateral to the left frontal horn at the site of prior AVM. There is encephalomalacia in this  area with a 5 mm calcification. No acute hemorrhage. Generalized atrophy. Negative for hydrocephalus. Negative for acute hemorrhage or mass. Vascular: Negative for hyperdense vessel Skull: Negative Sinuses: Paranasal sinuses clear. Orbits: No orbital lesion.  Right cataract extraction Review of the MIP images confirms the above findings CTA NECK FINDINGS Aortic arch: Standard branching. Imaged portion shows no evidence of aneurysm or dissection. No significant stenosis of the major arch vessel origins. Right carotid system: Minimal atherosclerotic disease right carotid bifurcation. Negative for stenosis. Left carotid system: Minimal atherosclerotic disease left carotid bifurcation. Negative for stenosis Vertebral arteries: Both vertebral arteries are widely patent to the skull base. Atherosclerotic calcification and severe stenosis of the vertebral artery at the foramen magnum bilaterally. Skeleton: Cervical spondylosis.  Multilevel spinal stenosis. Other neck: Negative Upper chest: Negative Review of the MIP images confirms the above findings CTA HEAD FINDINGS Anterior circulation: Atherosclerotic calcification cavernous carotid bilaterally without stenosis. Anterior cerebral arteries widely patent. Right middle cerebral artery widely patent Left M1 segment patent. There are multiple areas of severe stenosis involving the left MCA bifurcation and left M2 branches. No enhancing AVM. On the prior MRI, the enhancing AVM was present in the anterior sylvian fissure on  the left. No enlarged draining vein. Posterior circulation: Severe calcific stenosis of both vertebral arteries at the foramen magnum. Both vertebral arteries are then patent to the basilar. Basilar is patent. Superior cerebellar and posterior cerebral arteries patent bilaterally. Mild atherosclerotic disease in the posterior cerebral arteries bilaterally. No vascular malformation. Venous sinuses: Normal venous enhancement. Anatomic variants: None Review  of the MIP images confirms the above findings IMPRESSION: 1. No significant carotid stenosis in the neck 2. 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 Previously noted AVM in the left frontal lobe in the anterior sylvian fissure does not show vascular enhancement. Question interval treatment with radiation. There is considerable vascular stenosis in the left MCA bifurcation and left MCA branches which could be due to radiation. 4. CT head demonstrates no acute hemorrhage. Encephalomalacia in the left frontal lobe. Electronically Signed   By: Marlan Palau M.D.   On: 06/04/2021 20:17   MR BRAIN WO CONTRAST  Result Date: 06/04/2021 CLINICAL DATA:  Stroke suspected, difficulty talking EXAM: MRI HEAD WITHOUT CONTRAST TECHNIQUE: Multiplanar, multiecho pulse sequences of the brain and surrounding structures were obtained without intravenous contrast. COMPARISON:  12/09/2020, 11/29/2019 FINDINGS: Brain: No restricted diffusion to suggest acute or subacute infarction. No acute hemorrhage, mass, mass effect, or midline shift. No hydrocephalus or extra-axial collection. T2 hyperintense signal in the periventricular white matter, likely the sequela of chronic small vessel ischemic disease. 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 No other foci of hemosiderin deposition to suggest remote hemorrhage. Vascular: Normal flow voids. Skull and upper cervical spine: Normal marrow signal. Sinuses/Orbits: Negative.  Status post right lens replacement. Other: None. IMPRESSION: 1. No acute intracranial process. 2. Redemonstrated noncontrast findings related to a known left insular AVM, which is incompletely evaluated in the  absence of contrast; however, the noncontrast appearance is stable. 1. No acute intracranial process. Electronically Signed   By: Wiliam Ke M.D.   On: 06/04/2021 23:04    EKG: Independently reviewed. Sinus rhythm, non-specific IVCD.   Assessment/Plan  1. 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 Type II DM  - A1c was 8.4% in April 2022  - SSI for now   3. Hypertension  - Continue lisinopril and Toprol     DVT prophylaxis: Lovenox  Code Status: Full  Level of Care: Level of care: Telemetry Medical Family Communication: Son at bedside  Disposition Plan:  Patient is from: Home  Anticipated d/c is to: TBD Anticipated d/c date is: Possibly as early as 12/17 or 06/06/21  Patient currently: Pending EEG, additional labs, neuro checks  Consults called: Neurology  Admission status: Observation     Briscoe Deutscher, MD [facility]  06/05/2021, 2:12 AM

## 2021-06-05 NOTE — Progress Notes (Signed)
EEG complete - results pending 

## 2021-06-05 NOTE — Progress Notes (Signed)
Due to pt location,Tech not able to perform EEG at this time, will continue to check back .

## 2021-06-05 NOTE — ED Notes (Signed)
Pt uses [facility] in [place], [address]. Pt medication list could be obtained through this contact number of [phone]. Will notify pharmacy tech.

## 2021-06-05 NOTE — Consult Note (Signed)
Neurology Consultation  Reason for Consult: Aphasia Referring Physician: Rolan Bucco, MD  CC: Unintelligible speech  History is obtained from: Family member  HPI: Johnny Meyer is a 86 y.o. male with hx AVM s/p gamma knife, HTN, DM who presented to ED with unintelligible speech since 12/15 at 5PM. Per family, pt had a similar presentation years ago when he was diagnosed with AVM.  Pt was transferred to [facility] for MRI which was negative. Family denies hx of seizures.  Pt admitted for seizure and encephalopathy workup.  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  Social History:   reports that he has quit smoking. He has never used smokeless tobacco. He reports that he does not drink alcohol and does not use drugs.  Medications No current facility-administered medications for this encounter.  Current Outpatient Medications:    allopurinol (ZYLOPRIM) 300 MG tablet, TAKE 1 TABLETEVERY NIGHT, Disp: , Rfl:    amLODipine (NORVASC) 2.5 MG tablet, Take by mouth., Disp: , Rfl:    aspirin EC 81 MG tablet, Take by mouth., Disp: , Rfl:    atenolol-chlorthalidone (TENORETIC) 50-25 MG tablet, Takes 1/2 tablet by mouth daily, Disp: , Rfl:    atorvastatin (LIPITOR) 40 MG tablet, TAKE 1/2 TABLET EVERY DAY, Disp: , Rfl:    busPIRone (BUSPAR) 15 MG tablet, Take by mouth., Disp: , Rfl:    Colchicine 0.6 MG CAPS, Day 1: 1.2 mg p.o. x1 then 0.6 mg p.o. 1 hour later x1.  Day 2: 0.6 mg once or twice daily until flare resolves to a max of 3 days, Disp: , Rfl:    DULoxetine (CYMBALTA) 20 MG capsule, 2 capsules every am for chronic pain, Disp: , Rfl:    fosinopril (MONOPRIL) 40 MG tablet, TAKE 1/2 TABLET EVERY DAY, Disp: , Rfl:    gabapentin (NEURONTIN) 600 MG tablet, TAKE 1 TABLET THREE TIMES DAILY ( DOSE  INCREASE ), Disp: , Rfl:    HYDROcodone-acetaminophen (NORCO/VICODIN) 5-325 MG tablet, Take 1-2 tablets by mouth every 4 (four) hours as needed., Disp: 10 tablet, Rfl: 0   insulin glargine (LANTUS) 100 UNIT/ML injection, Inject into the skin., Disp: , Rfl:    metFORMIN (GLUCOPHAGE) 500 MG tablet, TAKE 1 TABLET TWICE DAILY WITH MEALS, Disp: , Rfl:    metoprolol succinate (TOPROL-XL) 100 MG 24 hr tablet, TAKE 1 TABLET EVERY DAY ( DOSE INCREASE ), Disp: , Rfl:    omeprazole (PRILOSEC) 40 MG capsule, TAKE 1 CAPSULE EVERY MORNING, Disp: , Rfl:   ROS:  Unable to obtain due to altered mental status.   Exam: Current vital signs: BP (!) 170/86 (BP Location: Left Arm)    Pulse 64    Temp 98.1 F (36.7 C) (Oral)    Resp 18    SpO2 100%  Vital signs in last 24 hours: Temp:  [97.9 F (36.6 C)-98.1 F (36.7 C)] 98.1 F (36.7 C) (12/16 2221) Pulse Rate:  [59-68] 64 (12/16 2342) Resp:  [16-22] 18 (12/16 2342) BP: (166-183)/(75-93) 170/86 (12/16 2342) SpO2:  [96 %-100 %] 100 % (12/16 2342)   Constitutional: Appears well-developed and well-nourished.  Psych: Affect appropriate to situation Eyes: No scleral injection HENT: No OP obstrucion Head: Normocephalic.  Cardiovascular: Normal rate and regular rhythm.  Respiratory: Effort normal, non-labored  breathing GI: Soft.  No distension. There is no tenderness.  Skin: WDI  Neuro: AAOx1, word finding difficulty, perseverates, names 1/3,  unable to repeat, follows simple commands EOMI, PERRL, no facial asymmetry Tongue and palate midline Nl bulk and tone, no drift Moves all ext antigravity Sensation grossly intact  Labs I have reviewed labs in epic and the results pertinent to this consultation are:   CBC    Component Value Date/Time   WBC 8.7 06/04/2021 1858   RBC 4.26 06/04/2021 1858   HGB 12.5 (L) 06/04/2021 1858   HCT 38.0 (L) 06/04/2021 1858   PLT 200 06/04/2021 1858   MCV 89.2 06/04/2021 1858   MCH 29.3 06/04/2021 1858   MCHC  32.9 06/04/2021 1858   RDW 14.6 06/04/2021 1858   LYMPHSABS 2.8 06/04/2021 1858   MONOABS 0.7 06/04/2021 1858   EOSABS 0.1 06/04/2021 1858   BASOSABS 0.1 06/04/2021 1858    CMP     Component Value Date/Time   NA 134 (L) 06/04/2021 1858   K 4.4 06/04/2021 1858   CL 103 06/04/2021 1858   CO2 21 (L) 06/04/2021 1858   GLUCOSE 217 (H) 06/04/2021 1858   BUN 14 06/04/2021 1858   CREATININE 1.12 06/04/2021 1858   CALCIUM 8.3 (L) 06/04/2021 1858   PROT 6.9 06/04/2021 1858   ALBUMIN 3.6 06/04/2021 1858   AST 15 06/04/2021 1858   ALT 11 06/04/2021 1858   ALKPHOS 92 06/04/2021 1858   BILITOT 0.5 06/04/2021 1858   GFRNONAA >60 06/04/2021 1858    Imaging  MRI examination of the brain: negative for acute intracranial pathology   Assessment: 86 yo M with hx AVM s/p gamma knife, HTN, DM p/w aphasia   Impression: r/o seizure vs toxic/metabolic disturbance  Recommendations: Cont neuro checks VEEG monitoring  Cont keppra 500mg  bid Check U/A, TSH, B12, folate, ammonia Consider LP if above negative Neuro will cont to follow  Total time spent  , MD Attending Neurologist

## 2021-06-05 NOTE — ED Notes (Signed)
Patient moved into room for comfort.

## 2021-06-06 ENCOUNTER — Observation Stay (HOSPITAL_COMMUNITY): Payer: Medicare HMO

## 2021-06-06 DIAGNOSIS — R4701 Aphasia: Secondary | ICD-10-CM | POA: Diagnosis not present

## 2021-06-06 DIAGNOSIS — R569 Unspecified convulsions: Secondary | ICD-10-CM | POA: Diagnosis not present

## 2021-06-06 LAB — BASIC METABOLIC PANEL
Anion gap: 8 (ref 5–15)
BUN: 7 mg/dL — ABNORMAL LOW (ref 8–23)
CO2: 23 mmol/L (ref 22–32)
Calcium: 8.3 mg/dL — ABNORMAL LOW (ref 8.9–10.3)
Chloride: 106 mmol/L (ref 98–111)
Creatinine, Ser: 1.01 mg/dL (ref 0.61–1.24)
GFR, Estimated: >60 mL/min (ref >60)
Glucose, Bld: 166 mg/dL — ABNORMAL HIGH (ref 70–99)
Potassium: 3.8 mmol/L (ref 3.5–5.1)
Sodium: 137 mmol/L (ref 135–145)

## 2021-06-06 LAB — CBG MONITORING, ED: Glucose-Capillary: 153 mg/dL — ABNORMAL HIGH (ref 70–99)

## 2021-06-06 MED ORDER — LEVETIRACETAM 500 MG PO TABS: 500.0000 mg | ORAL_TABLET | Freq: Two times a day (BID) | ORAL | 2 refills | Status: AC

## 2021-06-06 NOTE — Evaluation (Signed)
Physical Therapy Evaluation and Discharge Patient Details Name: Johnny Meyer MRN: [id number] DOB: [birth year] Today's Date: 06/06/2021  History of Present Illness  Pt is 86 y/o M presenting with aphasia. EEG WNL and MRI negative for CVA. 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  Clinical Impression  Patient evaluated by Physical Therapy with no further acute PT needs identified. PTA, pt lives with his wife and is independent with mobility using a cane. Pt demonstrating mild impulsivity, however, appears to be close to his functional baseline. Ambulating 200 feet with a walker without physical assist. With no assistive device, pt tends to reach intermittently for external support, however, no gross imbalance noted. Pt not receptive to education regarding activity recommendations, stating, "I'm not going to exercise, I am 86 years old." All education has been completed and the patient has no further questions. No follow-up Physical Therapy or equipment needs. PT is signing off. Thank you for this referral.      Recommendations for follow up therapy are one component of a multi-disciplinary discharge planning process, led by the attending physician.  Recommendations may be updated based on patient status, additional functional criteria and insurance authorization.  Follow Up Recommendations No PT follow up    Assistance Recommended at Discharge None  Functional Status Assessment Patient has not had a recent decline in their functional status  Equipment Recommendations  None recommended by PT    Recommendations for Other Services       Precautions / Restrictions Precautions Precautions: Fall Restrictions Weight Bearing Restrictions: No      Mobility  Bed Mobility Overal bed mobility: Modified Independent                  Transfers Overall transfer level: Modified independent Equipment used: Rolling walker  (2 wheels);None Transfers: Sit to/from Stand                  Ambulation/Gait Ambulation/Gait assistance: Modified independent (Device/Increase time) Gait Distance (Feet): 200 Feet Assistive device: Rolling walker (2 wheels) Gait Pattern/deviations: Step-through pattern;Trunk flexed Gait velocity: decreased     General Gait Details: slow and steady pace with walker, mild trunk flexion and decreased bilateral foot clearance. without RW, pt itnermittently reaching with single UE support  Information systems manager Rankin (Stroke Patients Only) Modified Rankin (Stroke Patients Only) Pre-Morbid Rankin Score: No significant disability Modified Rankin: No significant disability     Balance Overall balance assessment: Mild deficits observed, not formally tested                                           Pertinent Vitals/Pain Pain Assessment: No/denies pain    Home Living Family/patient expects to be discharged to:: Private residence Living Arrangements: Spouse/significant other Available Help at Discharge: Family;Available 24 hours/day Type of Home: Apartment Home Access: Elevator       Home Layout: One level Home Equipment: Agricultural consultant (2 wheels);Cane - single point;Wheelchair - manual;Shower seat;Grab bars - tub/shower;Grab bars - toilet      Prior Function Prior Level of Function : Independent/Modified Independent             Mobility Comments: uses cane       Hand Dominance   Dominant Hand: Right    Extremity/Trunk  Assessment   Upper Extremity Assessment Upper Extremity Assessment: Overall WFL for tasks assessed    Lower Extremity Assessment Lower Extremity Assessment: Overall WFL for tasks assessed       Communication   Communication: HOH  Cognition Arousal/Alertness: Awake/alert Behavior During Therapy: WFL for tasks assessed/performed Overall Cognitive Status: No family/caregiver present  to determine baseline cognitive functioning                                 General Comments: Pt A&Ox4, demonstrates some impulsivity. perseverating on finding his jacket to the point he actually tried to go into another patient's room (which was his former room). PT found his clothes at the head of the bed upon return to the room        General Comments      Exercises     Assessment/Plan    PT Assessment Patient does not need any further PT services  PT Problem List         PT Treatment Interventions      PT Goals (Current goals can be found in the Care Plan section)  Acute Rehab PT Goals Patient Stated Goal: does not want additional therapy PT Goal Formulation: All assessment and education complete, DC therapy    Frequency     Barriers to discharge        Co-evaluation               AM-PAC PT "6 Clicks" Mobility  Outcome Measure Help needed turning from your back to your side while in a flat bed without using bedrails?: None Help needed moving from lying on your back to sitting on the side of a flat bed without using bedrails?: None Help needed moving to and from a bed to a chair (including a wheelchair)?: None Help needed standing up from a chair using your arms (e.g., wheelchair or bedside chair)?: None Help needed to walk in hospital room?: None Help needed climbing 3-5 steps with a railing? : A Little 6 Click Score: 23    End of Session   Activity Tolerance: Patient tolerated treatment well Patient left: in bed;with call bell/phone within reach Nurse Communication: Mobility status PT Visit Diagnosis: Unsteadiness on feet (R26.81)    Time: 7253-6644 PT Time Calculation (min) (ACUTE ONLY): 16 min   Charges:   PT Evaluation $PT Eval Low Complexity: 1 Low          Lillia Pauls, PT, DPT Acute Rehabilitation Services Pager [phone] Office [phone]   Norval Morton 06/06/2021, 2:15 PM

## 2021-06-06 NOTE — Discharge Summary (Signed)
Physician Discharge Summary  Johnny Meyer ZHY:[id number] DOB: [birth year] DOA: 06/04/2021  PCP: Johnny Meyer., FNP  Admit date: 06/04/2021 Discharge date: 06/06/2021  Admitted From: Home Disposition:  Home   Recommendations for Outpatient Follow-up:  Follow up with PCP in 1-2 weeks Please obtain BMP/CBC in one week Please follow up with neurology as an outpatient, ambulatory referral has been sent [facility]  Home Health:NO   Discharge Condition:Stable CODE STATUS:FULL Diet recommendation: Heart Healthy   Brief/Interim Summary:    Aphasia>> concern for seizures -History of AVM status post gamma knife -MRI no acute findings -Neurology input greatly appreciated, spot EEG with no seizures, patient refused LTM EEG monitoring, he was hooked up to a overnight EEG, he refused it and he took of the leads, and would not let the technologist reapply the leads. -I have discussed with neurology, patient with history of AVM status post gamma knife, high risk for seizures, and findings are clinically suspicious for seizures, so recommendation to start Keppra 500 mg oral twice daily, to continue home dose gabapentin, and outpatient neurology follow-up in 4 to 6 weeks -Patient was given seizure precautions, but he is driving no more as discussed with the patient, and as confirmed by the son. -Ammonia, folate, B12, UDS unremarkable, COVID screen/flu screen negative  Hypoxia -No hypoxia noted today, he would likely benefit from sleep study as an outpatient.  Resolved     Mild hyponatremia Sodium 133 resolved   Insulin-dependent type 2 diabetes Continue with home regimen   Hypertension Continue with home regiment   Hyperlipidemia Continue Lipitor   History of gout Continue allopurinol   Mild normocytic anemia Hemoglobin 12.5 Follow-up with PCP    Discharge Diagnoses:  Principal Problem:   Aphasia Active Problems:   Diabetes mellitus without complication (HCC)    Hypertension    Discharge Instructions  Discharge Instructions     Diet - low sodium heart healthy   Complete by: As directed    Discharge instructions   Complete by: As directed    Do not drive, operating heavy machinery, perform activities at heights, swimming or participation in water activities or provide baby sitting services due  siezures until you have seen by Primary MD or a Neurologist and advised to do so again.   Follow with Primary MD Johnny Meyer., FNP in 7 days   Get CBC, CMP,  checked  by Primary MD next visit.    Activity: As tolerated with Full fall precautions use walker/cane & assistance as needed   Disposition Home    Diet: Heart Healthy   On your next visit with your primary care physician please Get Medicines reviewed and adjusted.   Please request your Prim.MD to go over all Hospital Tests and Procedure/Radiological results at the follow up, please get all Hospital records sent to your Prim MD by signing hospital release before you go home.   If you experience worsening of your admission symptoms, develop shortness of breath, life threatening emergency, suicidal or homicidal thoughts you must seek medical attention immediately by calling 911 or calling your MD immediately  if symptoms less severe.  You Must read complete instructions/literature along with all the possible adverse reactions/side effects for all the Medicines you take and that have been prescribed to you. Take any new Medicines after you have completely understood and accpet all the possible adverse reactions/side effects.     Do not drive when taking Pain medications.    Do not take more than prescribed  Pain, Sleep and Anxiety Medications  Special Instructions: If you have smoked or chewed Tobacco  in the last 2 yrs please stop smoking, stop any regular Alcohol  and or any Recreational drug use.  Wear Seat belts while driving.   Please note  You were cared for by a  hospitalist during your hospital stay. If you have any questions about your discharge medications or the care you received while you were in the hospital after you are discharged, you can call the unit and asked to speak with the hospitalist on call if the hospitalist that took care of you is not available. Once you are discharged, your primary care physician will handle any further medical issues. Please note that NO REFILLS for any discharge medications will be authorized once you are discharged, as it is imperative that you return to your primary care physician (or establish a relationship with a primary care physician if you do not have one) for your aftercare needs so that they can reassess your need for medications and monitor your lab values.   Increase activity slowly   Complete by: As directed       Allergies as of 06/06/2021   No Known Allergies      Medication List     TAKE these medications    allopurinol 300 MG tablet Commonly known as: ZYLOPRIM Take 300 mg by mouth at bedtime.   amLODipine 5 MG tablet Commonly known as: NORVASC Take 5 mg by mouth daily.   atorvastatin 40 MG tablet Commonly known as: LIPITOR Take 20 mg by mouth daily.   busPIRone 15 MG tablet Commonly known as: BUSPAR Take 15 mg by mouth 3 (three) times daily.   DULoxetine 20 MG capsule Commonly known as: CYMBALTA Take 40 mg by mouth daily as needed (pain).   furosemide 20 MG tablet Commonly known as: LASIX Take 20 mg by mouth daily as needed (swelling).   gabapentin 600 MG tablet Commonly known as: NEURONTIN Take 600 mg by mouth 3 (three) times daily.   glimepiride 2 MG tablet Commonly known as: AMARYL Take 2 mg by mouth daily.   levETIRAcetam 500 MG tablet Commonly known as: KEPPRA Take 1 tablet (500 mg total) by mouth 2 (two) times daily.   lisinopril 20 MG tablet Commonly known as: ZESTRIL Take 20 mg by mouth daily.   metFORMIN 500 MG tablet Commonly known as: GLUCOPHAGE Take  1,000 mg by mouth 2 (two) times daily with a meal.   methocarbamol 500 MG tablet Commonly known as: ROBAXIN Take 500 mg by mouth 3 (three) times daily.   metoprolol succinate 50 MG 24 hr tablet Commonly known as: TOPROL-XL Take 50 mg by mouth daily. What changed: Another medication with the same name was removed. Continue taking this medication, and follow the directions you see here.   multivitamin with minerals Tabs tablet Take 1 tablet by mouth daily.   omeprazole 40 MG capsule Commonly known as: PRILOSEC Take 40 mg by mouth daily.   tamsulosin 0.4 MG Caps capsule Commonly known as: FLOMAX Take 0.4 mg by mouth daily.        Follow-up Information     Johnny Meyer., FNP Follow up.   Specialty: Internal Medicine Contact information: [address] [place] [address] [phone]                No Known Allergies  Consultations: neurology   Procedures/Studies: CT Angio Head W or Wo Contrast  Result  Date: 06/04/2021 CLINICAL DATA:  History of AVM. Acute neuro deficit. Rule out stroke. Aphasia. EXAM: CT ANGIOGRAPHY HEAD AND NECK TECHNIQUE: Multidetector CT imaging of the head and neck was performed using the standard protocol during bolus administration of intravenous contrast. Multiplanar CT image reconstructions and MIPs were obtained to evaluate the vascular anatomy. Carotid stenosis measurements (when applicable) are obtained utilizing NASCET criteria, using the distal internal carotid diameter as the denominator. CONTRAST:  OMNIPAQUE IOHEXOL 350 MG/ML SOLN COMPARISON:  MRI head with contrast 12/09/2020 FINDINGS: CT HEAD FINDINGS Brain: Hypodensity lateral to the left frontal horn at the site of prior AVM. There is encephalomalacia in this area with a 5 mm calcification. No acute hemorrhage. Generalized atrophy. Negative for hydrocephalus. Negative for acute hemorrhage or mass. Vascular: Negative for hyperdense vessel Skull:  Negative Sinuses: Paranasal sinuses clear. Orbits: No orbital lesion.  Right cataract extraction Review of the MIP images confirms the above findings CTA NECK FINDINGS Aortic arch: Standard branching. Imaged portion shows no evidence of aneurysm or dissection. No significant stenosis of the major arch vessel origins. Right carotid system: Minimal atherosclerotic disease right carotid bifurcation. Negative for stenosis. Left carotid system: Minimal atherosclerotic disease left carotid bifurcation. Negative for stenosis Vertebral arteries: Both vertebral arteries are widely patent to the skull base. Atherosclerotic calcification and severe stenosis of the vertebral artery at the foramen magnum bilaterally. Skeleton: Cervical spondylosis.  Multilevel spinal stenosis. Other neck: Negative Upper chest: Negative Review of the MIP images confirms the above findings CTA HEAD FINDINGS Anterior circulation: Atherosclerotic calcification cavernous carotid bilaterally without stenosis. Anterior cerebral arteries widely patent. Right middle cerebral artery widely patent Left M1 segment patent. There are multiple areas of severe stenosis involving the left MCA bifurcation and left M2 branches. No enhancing AVM. On the prior MRI, the enhancing AVM was present in the anterior sylvian fissure on the left. No enlarged draining vein. Posterior circulation: Severe calcific stenosis of both vertebral arteries at the foramen magnum. Both vertebral arteries are then patent to the basilar. Basilar is patent. Superior cerebellar and posterior cerebral arteries patent bilaterally. Mild atherosclerotic disease in the posterior cerebral arteries bilaterally. No vascular malformation. Venous sinuses: Normal venous enhancement. Anatomic variants: None Review of the MIP images confirms the above findings IMPRESSION: 1. No significant carotid stenosis in the neck 2. Severe stenosis of both vertebral arteries at the skull base due to  atherosclerotic calcification 3. Previously noted AVM in the left frontal lobe in the anterior sylvian fissure does not show vascular enhancement. Question interval treatment with radiation. There is considerable vascular stenosis in the left MCA bifurcation and left MCA branches which could be due to radiation. 4. CT head demonstrates no acute hemorrhage. Encephalomalacia in the left frontal lobe. Electronically Signed   By: Marlan Palau M.D.   On: 06/04/2021 20:17   CT Angio Neck W and/or Wo Contrast  Result Date: 06/04/2021 CLINICAL DATA:  History of AVM. Acute neuro deficit. Rule out stroke. Aphasia. EXAM: CT ANGIOGRAPHY HEAD AND NECK TECHNIQUE: Multidetector CT imaging of the head and neck was performed using the standard protocol during bolus administration of intravenous contrast. Multiplanar CT image reconstructions and MIPs were obtained to evaluate the vascular anatomy. Carotid stenosis measurements (when applicable) are obtained utilizing NASCET criteria, using the distal internal carotid diameter as the denominator. CONTRAST:  OMNIPAQUE IOHEXOL 350 MG/ML SOLN COMPARISON:  MRI head with contrast 12/09/2020 FINDINGS: CT HEAD FINDINGS Brain: Hypodensity lateral to the left frontal horn at the site of prior  AVM. There is encephalomalacia in this area with a 5 mm calcification. No acute hemorrhage. Generalized atrophy. Negative for hydrocephalus. Negative for acute hemorrhage or mass. Vascular: Negative for hyperdense vessel Skull: Negative Sinuses: Paranasal sinuses clear. Orbits: No orbital lesion.  Right cataract extraction Review of the MIP images confirms the above findings CTA NECK FINDINGS Aortic arch: Standard branching. Imaged portion shows no evidence of aneurysm or dissection. No significant stenosis of the major arch vessel origins. Right carotid system: Minimal atherosclerotic disease right carotid bifurcation. Negative for stenosis. Left carotid system: Minimal atherosclerotic  disease left carotid bifurcation. Negative for stenosis Vertebral arteries: Both vertebral arteries are widely patent to the skull base. Atherosclerotic calcification and severe stenosis of the vertebral artery at the foramen magnum bilaterally. Skeleton: Cervical spondylosis.  Multilevel spinal stenosis. Other neck: Negative Upper chest: Negative Review of the MIP images confirms the above findings CTA HEAD FINDINGS Anterior circulation: Atherosclerotic calcification cavernous carotid bilaterally without stenosis. Anterior cerebral arteries widely patent. Right middle cerebral artery widely patent Left M1 segment patent. There are multiple areas of severe stenosis involving the left MCA bifurcation and left M2 branches. No enhancing AVM. On the prior MRI, the enhancing AVM was present in the anterior sylvian fissure on the left. No enlarged draining vein. Posterior circulation: Severe calcific stenosis of both vertebral arteries at the foramen magnum. Both vertebral arteries are then patent to the basilar. Basilar is patent. Superior cerebellar and posterior cerebral arteries patent bilaterally. Mild atherosclerotic disease in the posterior cerebral arteries bilaterally. No vascular malformation. Venous sinuses: Normal venous enhancement. Anatomic variants: None Review of the MIP images confirms the above findings IMPRESSION: 1. No significant carotid stenosis in the neck 2. 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 Previously noted AVM in the left frontal lobe in the anterior sylvian fissure does not show vascular enhancement. Question interval treatment with radiation. There is considerable vascular stenosis in the left MCA bifurcation and left MCA branches which could be due to radiation. 4. CT head demonstrates no acute hemorrhage. Encephalomalacia in the left frontal lobe. Electronically Signed   By: Marlan Palau M.D.   On: 06/04/2021 20:17   MR  BRAIN WO CONTRAST  Result Date: 06/04/2021 CLINICAL DATA:  Stroke suspected, difficulty talking EXAM: MRI HEAD WITHOUT CONTRAST TECHNIQUE: Multiplanar, multiecho pulse sequences of the brain and surrounding structures were obtained without intravenous contrast. COMPARISON:  12/09/2020, 11/29/2019 FINDINGS: Brain: No restricted diffusion to suggest acute or subacute infarction. No acute hemorrhage, mass, mass effect, or midline shift. No hydrocephalus or extra-axial collection. T2 hyperintense signal in the periventricular white matter, likely the sequela of chronic small vessel ischemic disease. 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 No other foci of hemosiderin deposition to suggest remote hemorrhage. Vascular: Normal flow voids. Skull and upper cervical spine: Normal marrow signal. Sinuses/Orbits: Negative.  Status post right lens replacement. Other: None. IMPRESSION: 1. No acute intracranial process. 2. Redemonstrated noncontrast findings related to a known left insular AVM, which is incompletely evaluated in the absence of contrast; however, the noncontrast appearance is stable. 1. No acute intracranial process. Electronically Signed   By: Wiliam Ke M.D.   On: 06/04/2021 23:04   DG CHEST PORT 1 VIEW  Result Date: 06/05/2021 CLINICAL DATA:  Aphasia EXAM: PORTABLE CHEST 1 VIEW COMPARISON:  Chest x-ray 06/28/2019 FINDINGS: Heart is enlarged. Mediastinum appears stable. Low lung volumes with no focal consolidation identified. Pulmonary vasculature is within normal limits. No pleural effusion or pneumothorax. IMPRESSION: Cardiomegaly and low lung volumes with no focal consolidation identified. Electronically Signed   By: Jannifer Hick M.D.   On:  06/05/2021 11:37   EEG adult  Result Date: 06/05/2021 Charlsie Quest, MD     06/05/2021  8:35 AM Patient Name: Johnny Meyer MRN: [id number] Epilepsy Attending: Charlsie Quest Referring Physician/Provider: Dr Odie Sera Date: 06/05/2021 Duration: 25.57 mins Patient history: 86 yo M with hx AVM s/p gamma knife, HTN, DM p/w aphasia. EEG to evaluate for seizure Level of alertness: Awake, asleep AEDs during EEG study: LEV Technical aspects: This EEG study was done with scalp electrodes positioned according to the 10-20 International system of electrode placement. Electrical activity was acquired at a sampling rate of  and reviewed with a high frequency filter of  and a low frequency filter of . EEG data were recorded continuously and digitally stored. Description: The posterior dominant rhythm consists of 8 Hz activity of moderate voltage (25-35 uV) seen predominantly in posterior head regions, symmetric and reactive to eye opening and eye closing. Sleep was characterized by vertex waves, sleep spindles (12 to 14 Hz), maximal frontocentral region. Hyperventilation and photic stimulation were not performed.   EEG was technically difficult due to significant myogenic artifact and EKG artifact. IMPRESSION: This technically difficult study is within normal limits. No seizures or epileptiform discharges were seen throughout the recording. If suspicion for ictal-interictal activity remains a concern, a prolonged study should be considered. Priyanka Annabelle Harman   Overnight EEG with video  Result Date: 06/06/2021 Charlsie Quest, MD     06/06/2021  8:34 AM Patient Name: Perkins Molina MRN: [id number] Epilepsy Attending: Charlsie Quest Referring Physician/Provider: Dr Milon Dikes Duration: 06/06/2021 2130 to 06/06/2021 0436  Patient history: 86 yo M with hx AVM s/p gamma knife, HTN, DM p/w aphasia. EEG to evaluate for seizure  Level of alertness: Awake, asleep  AEDs during EEG study: LEV  Technical  aspects: This EEG study was done with scalp electrodes positioned according to the 10-20 International system of electrode placement. Electrical activity was acquired at a sampling rate of  and reviewed with a high frequency filter of  and a low frequency filter of . EEG data were recorded continuously and digitally stored.  Description: The posterior dominant rhythm consists of 8 Hz activity of moderate voltage (25-35 uV) seen predominantly in posterior head regions, symmetric and reactive to eye opening and eye closing.  Hyperventilation and photic stimulation were not performed.    EEG was technically difficult due to significant myogenic artifact and EKG artifact. Patient declined study and had to discontinued after 0436.  IMPRESSION: This technically difficult study is within normal limits. No seizures or epileptiform discharges were seen throughout the recording.  Priyanka Annabelle Harman     Subjective:  No significant events overnight as discussed with staff, patient himself denies any complaints. Discharge Exam: Vitals:   06/06/21 0930 06/06/21 1000  BP: (!) 151/133 (!) 163/91  Pulse: 81   Resp: 14 (!) 24  Temp:    SpO2: 98% 100%   Vitals:   06/06/21 0602 06/06/21 0747 06/06/21 0930 06/06/21 1000  BP:  (!) 151/93 (!) 151/133 (!) 163/91  Pulse:  74 81   Resp:  19 14 (!) 24  Temp: 98.3 F (  36.8 C) 98.1 F (36.7 C)    TempSrc:      SpO2:  100% 98% 100%    General: Pt is alert, awake, not in acute distress, he is oriented x3, he knows it is a hospital but he does not know the name of [facility], he answering his questions appropriately, able to follow commands, Cardiovascular: RRR, S1/S2 +, no rubs, no gallops Respiratory: CTA bilaterally, no wheezing, no rhonchi Abdominal: Soft, NT, ND, bowel sounds + Extremities: no edema, no cyanosis    The results of significant diagnostics from this hospitalization (including imaging, microbiology, ancillary and laboratory)  are listed below for reference.     Microbiology: Recent Results (from the past 240 hour(s))  Resp Panel by RT-PCR (Flu A&B, Covid) Nasopharyngeal Swab     Status: None   Collection Time: 06/04/21  7:50 PM   Specimen: Nasopharyngeal Swab; Nasopharyngeal(NP) swabs in vial transport medium  Result Value Ref Range Status   SARS Coronavirus 2 by RT PCR NEGATIVE NEGATIVE Final    Comment: (NOTE) SARS-CoV-2 target nucleic acids are NOT DETECTED.  The SARS-CoV-2 RNA is generally detectable in upper respiratory specimens during the acute phase of infection. The lowest concentration of SARS-CoV-2 viral copies this assay can detect is 138 copies/mL. A negative result does not preclude SARS-Cov-2 infection and should not be used as the sole basis for treatment or other patient management decisions. A negative result may occur with  improper specimen collection/handling, submission of specimen other than nasopharyngeal swab, presence of viral mutation(s) within the areas targeted by this assay, and inadequate number of viral copies(<138 copies/mL). A negative result must be combined with clinical observations, patient history, and epidemiological information. The expected result is Negative.  Fact Sheet for Patients:  BloggerCourse.com  Fact Sheet for Healthcare Providers:  SeriousBroker.it  This test is no t yet approved or cleared by the [place] FDA and  has been authorized for detection and/or diagnosis of SARS-CoV-2 by FDA under an Emergency Use Authorization (EUA). This EUA will remain  in effect (meaning this test can be used) for the duration of the COVID-19 declaration under Section 564(b)(1) of the Act, 21 U.S.C.section 360bbb-3(b)(1), unless the authorization is terminated  or revoked sooner.       Influenza A by PCR NEGATIVE NEGATIVE Final   Influenza B by PCR NEGATIVE NEGATIVE Final    Comment: (NOTE) The Xpert Xpress  SARS-CoV-2/FLU/RSV plus assay is intended as an aid in the diagnosis of influenza from Nasopharyngeal swab specimens and should not be used as a sole basis for treatment. Nasal washings and aspirates are unacceptable for Xpert Xpress SARS-CoV-2/FLU/RSV testing.  Fact Sheet for Patients: BloggerCourse.com  Fact Sheet for Healthcare Providers: SeriousBroker.it  This test is not yet approved or cleared by the [place] FDA and has been authorized for detection and/or diagnosis of SARS-CoV-2 by FDA under an Emergency Use Authorization (EUA). This EUA will remain in effect (meaning this test can be used) for the duration of the COVID-19 declaration under Section 564(b)(1) of the Act, 21 U.S.C. section 360bbb-3(b)(1), unless the authorization is terminated or revoked.  Performed at [facility], [address] [place], [place] [address]      Labs: BNP (last 3 results) No results for input(s): BNP in the last 8760 hours. Basic Metabolic Panel: Recent Labs  Lab 06/04/21 1858 06/05/21 0341 06/05/21 1140 06/06/21 0308  NA 134* 133* 139 137  K 4.4 4.2 3.7 3.8  CL 103  103  --  106  CO2 21* 24  --  23  GLUCOSE 217* 187*  --  166*  BUN 14 11  --  7*  CREATININE 1.12 1.06  --  1.01  CALCIUM 8.3* 8.0*  --  8.3*   Liver Function Tests: Recent Labs  Lab 06/04/21 1858  AST 15  ALT 11  ALKPHOS 92  BILITOT 0.5  PROT 6.9  ALBUMIN 3.6   No results for input(s): LIPASE, AMYLASE in the last 168 hours. Recent Labs  Lab 06/05/21 0341  AMMONIA 23   CBC: Recent Labs  Lab 06/04/21 1858 06/05/21 0341 06/05/21 1140  WBC 8.7 8.1  --   NEUTROABS 5.0  --   --   HGB 12.5* 12.5* 12.9*  HCT 38.0* 38.6* 38.0*  MCV 89.2 89.8  --   PLT 200 200  --    Cardiac Enzymes: No results for input(s): CKTOTAL, CKMB, CKMBINDEX, TROPONINI in the last 168 hours. BNP: Invalid input(s): POCBNP CBG: Recent Labs  Lab  06/05/21 0823 06/05/21 1132 06/05/21 1647 06/05/21 2111 06/06/21 0745  GLUCAP 200* 122* 148* 208* 153*   D-Dimer Recent Labs    06/05/21 1050  DDIMER 1.01*   Hgb A1c No results for input(s): HGBA1C in the last 72 hours. Lipid Profile No results for input(s): CHOL, HDL, LDLCALC, TRIG, CHOLHDL, LDLDIRECT in the last 72 hours. Thyroid function studies Recent Labs    06/05/21 0341  TSH 4.902*   Anemia work up Recent Labs    06/05/21 0341  VITAMINB12 413  FOLATE 16.4   Urinalysis    Component Value Date/Time   COLORURINE YELLOW 06/05/2021 0341   APPEARANCEUR CLEAR 06/05/2021 0341   LABSPEC 1.010 06/05/2021 0341   PHURINE 5.5 06/05/2021 0341   GLUCOSEU 250 (A) 06/05/2021 0341   HGBUR NEGATIVE 06/05/2021 0341   BILIRUBINUR NEGATIVE 06/05/2021 0341   KETONESUR NEGATIVE 06/05/2021 0341   PROTEINUR NEGATIVE 06/05/2021 0341   NITRITE NEGATIVE 06/05/2021 0341   LEUKOCYTESUR NEGATIVE 06/05/2021 0341   Sepsis Labs Invalid input(s): PROCALCITONIN,  WBC,  LACTICIDVEN Microbiology Recent Results (from the past 240 hour(s))  Resp Panel by RT-PCR (Flu A&B, Covid) Nasopharyngeal Swab     Status: None   Collection Time: 06/04/21  7:50 PM   Specimen: Nasopharyngeal Swab; Nasopharyngeal(NP) swabs in vial transport medium  Result Value Ref Range Status   SARS Coronavirus 2 by RT PCR NEGATIVE NEGATIVE Final    Comment: (NOTE) SARS-CoV-2 target nucleic acids are NOT DETECTED.  The SARS-CoV-2 RNA is generally detectable in upper respiratory specimens during the acute phase of infection. The lowest concentration of SARS-CoV-2 viral copies this assay can detect is 138 copies/mL. A negative result does not preclude SARS-Cov-2 infection and should not be used as the sole basis for treatment or other patient management decisions. A negative result may occur with  improper specimen collection/handling, submission of specimen other than nasopharyngeal swab, presence of viral  mutation(s) within the areas targeted by this assay, and inadequate number of viral copies(<138 copies/mL). A negative result must be combined with clinical observations, patient history, and epidemiological information. The expected result is Negative.  Fact Sheet for Patients:  BloggerCourse.com  Fact Sheet for Healthcare Providers:  SeriousBroker.it  This test is no t yet approved or cleared by the [place] FDA and  has been authorized for detection and/or diagnosis of SARS-CoV-2 by FDA under an Emergency Use Authorization (EUA). This EUA will remain  in effect (meaning this test can be used) for  the duration of the COVID-19 declaration under Section 564(b)(1) of the Act, 21 U.S.C.section 360bbb-3(b)(1), unless the authorization is terminated  or revoked sooner.       Influenza A by PCR NEGATIVE NEGATIVE Final   Influenza B by PCR NEGATIVE NEGATIVE Final    Comment: (NOTE) The Xpert Xpress SARS-CoV-2/FLU/RSV plus assay is intended as an aid in the diagnosis of influenza from Nasopharyngeal swab specimens and should not be used as a sole basis for treatment. Nasal washings and aspirates are unacceptable for Xpert Xpress SARS-CoV-2/FLU/RSV testing.  Fact Sheet for Patients: BloggerCourse.com  Fact Sheet for Healthcare Providers: SeriousBroker.it  This test is not yet approved or cleared by the [place] FDA and has been authorized for detection and/or diagnosis of SARS-CoV-2 by FDA under an Emergency Use Authorization (EUA). This EUA will remain in effect (meaning this test can be used) for the duration of the COVID-19 declaration under Section 564(b)(1) of the Act, 21 U.S.C. section 360bbb-3(b)(1), unless the authorization is terminated or revoked.  Performed at [facility], [address] [place], [place] [address]      Time coordinating  discharge: Over 30 minutes  SIGNED:   Huey Bienenstock, MD  [facility] 06/06/2021, 12:05 PM Pager   If 7PM-7AM, please contact night-coverage [email or web address] Password [email or web address]

## 2021-06-06 NOTE — Progress Notes (Signed)
Patient EEG leads are completely off.  Will need to be reapplied

## 2021-06-06 NOTE — ED Notes (Signed)
Breakfast orders placed 

## 2021-06-06 NOTE — ED Notes (Signed)
Report received care of pt assumed.  NAD noted at present, VSS, pt voices no c/o or needs at this time.  Will monitor.

## 2021-06-06 NOTE — ED Notes (Signed)
The pt is getting an eeg

## 2021-06-06 NOTE — Discharge Instructions (Signed)
°  Do not drive, operating heavy machinery, perform activities at heights, swimming or participation in water activities or provide baby sitting services due  siezures until you have seen by Primary MD or a Neurologist and advised to do so again.   Follow with Primary MD Greer Ee., FNP in 7 days   Get CBC, CMP,  checked  by Primary MD next visit.    Activity: As tolerated with Full fall precautions use walker/cane & assistance as needed   Disposition Home    Diet: Heart Healthy   On your next visit with your primary care physician please Get Medicines reviewed and adjusted.   Please request your Prim.MD to go over all Hospital Tests and Procedure/Radiological results at the follow up, please get all Hospital records sent to your Prim MD by signing hospital release before you go home.   If you experience worsening of your admission symptoms, develop shortness of breath, life threatening emergency, suicidal or homicidal thoughts you must seek medical attention immediately by calling 911 or calling your MD immediately  if symptoms less severe.  You Must read complete instructions/literature along with all the possible adverse reactions/side effects for all the Medicines you take and that have been prescribed to you. Take any new Medicines after you have completely understood and accpet all the possible adverse reactions/side effects.     Do not drive when taking Pain medications.    Do not take more than prescribed Pain, Sleep and Anxiety Medications  Special Instructions: If you have smoked or chewed Tobacco  in the last 2 yrs please stop smoking, stop any regular Alcohol  and or any Recreational drug use.  Wear Seat belts while driving.   Please note  You were cared for by a hospitalist during your hospital stay. If you have any questions about your discharge medications or the care you received while you were in the hospital after you are discharged, you can call  the unit and asked to speak with the hospitalist on call if the hospitalist that took care of you is not available. Once you are discharged, your primary care physician will handle any further medical issues. Please note that NO REFILLS for any discharge medications will be authorized once you are discharged, as it is imperative that you return to your primary care physician (or establish a relationship with a primary care physician if you do not have one) for your aftercare needs so that they can reassess your need for medications and monitor your lab values.

## 2021-06-06 NOTE — Evaluation (Signed)
Occupational Therapy Evaluation Patient Details Name: Johnny Meyer MRN: [id number] DOB: 08-11-1934 Today's Date: 06/06/2021   History of Present Illness Pt is 86 y/o M presenting with aphasia. EEG WNL and MRI negative for CVA. 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   Clinical Impression   Pt presents with decreased balance and cognition. Pt A&Ox3, however with some delayed processing and slightly impulsive. No family present to determine baseline cognition. Currently recommend supervision for LB ADLs and functional transfers/mobility. Pt reports modified independent at baseline and lives with wife who is available to provide assistance as needed at home. Should be safe to return home without any further skilled OT services once medically cleared. Will sign off.     Recommendations for follow up therapy are one component of a multi-disciplinary discharge planning process, led by the attending physician.  Recommendations may be updated based on patient status, additional functional criteria and insurance authorization.   Follow Up Recommendations  No OT follow up    Assistance Recommended at Discharge Set up Supervision/Assistance  Functional Status Assessment  Patient has had a recent decline in their functional status and demonstrates the ability to make significant improvements in function in a reasonable and predictable amount of time.  Equipment Recommendations  None recommended by OT    Recommendations for Other Services       Precautions / Restrictions Precautions Precautions: Fall Restrictions Weight Bearing Restrictions: No      Mobility Bed Mobility Overal bed mobility: Modified Independent                  Transfers Overall transfer level: Needs assistance Equipment used: Rolling walker (2 wheels) Transfers: Sit to/from Stand Sit to Stand: Supervision                  Balance  Overall balance assessment: Mild deficits observed, not formally tested                                         ADL either performed or assessed with clinical judgement   ADL Overall ADL's : Needs assistance/impaired Eating/Feeding: Independent   Grooming: Independent   Upper Body Bathing: Independent   Lower Body Bathing: Supervison/ safety;Sit to/from stand   Upper Body Dressing : Independent   Lower Body Dressing: Supervision/safety   Toilet Transfer: Supervision/safety;Ambulation;Regular Toilet;Rolling walker (2 wheels)   Toileting- Clothing Manipulation and Hygiene: Supervision/safety   Tub/ Shower Transfer: Supervision/safety   Functional mobility during ADLs: Supervision/safety;Rolling walker (2 wheels)       Vision Baseline Vision/History: 1 Wears glasses Patient Visual Report: No change from baseline       Perception     Praxis      Pertinent Vitals/Pain Pain Assessment: No/denies pain     Hand Dominance Right   Extremity/Trunk Assessment Upper Extremity Assessment Upper Extremity Assessment: Overall WFL for tasks assessed   Lower Extremity Assessment Lower Extremity Assessment: Defer to PT evaluation       Communication Communication Communication: HOH   Cognition Arousal/Alertness: Awake/alert Behavior During Therapy: WFL for tasks assessed/performed Overall Cognitive Status: No family/caregiver present to determine baseline cognitive functioning  General Comments       Exercises     Shoulder Instructions      Home Living Family/patient expects to be discharged to:: Private residence Living Arrangements: Spouse/significant other Available Help at Discharge: Family;Available 24 hours/day Type of Home: Apartment Home Access: Elevator     Home Layout: One level     Bathroom Shower/Tub: Producer, television/film/video: Handicapped height     Home  Equipment: Agricultural consultant (2 wheels);Cane - single point;Wheelchair - manual;Shower seat;Grab bars - tub/shower;Grab bars - toilet          Prior Functioning/Environment Prior Level of Function : Independent/Modified Independent                        OT Problem List:        OT Treatment/Interventions:      OT Goals(Current goals can be found in the care plan section) Acute Rehab OT Goals Patient Stated Goal: return home OT Goal Formulation: With patient  OT Frequency:     Barriers to D/C:            Co-evaluation              AM-PAC OT "6 Clicks" Daily Activity     Outcome Measure Help from another person eating meals?: None Help from another person taking care of personal grooming?: None Help from another person toileting, which includes using toliet, bedpan, or urinal?: A Little Help from another person bathing (including washing, rinsing, drying)?: A Little Help from another person to put on and taking off regular upper body clothing?: None Help from another person to put on and taking off regular lower body clothing?: A Little 6 Click Score: 21   End of Session Equipment Utilized During Treatment: Gait belt;Rolling walker (2 wheels) Nurse Communication: Mobility status  Activity Tolerance: Patient tolerated treatment well Patient left: in bed;with call bell/phone within reach  OT Visit Diagnosis: Unsteadiness on feet (R26.81)                Time: 3570-1779 OT Time Calculation (min): 15 min Charges:  OT General Charges $OT Visit: 1 Visit OT Evaluation $OT Eval Low Complexity: 1 Low  Johnny Meyer C, OT/L  Acute Rehab [phone]  Johnny Meyer 06/06/2021, 11:35 AM

## 2021-06-06 NOTE — Progress Notes (Signed)
Patient declined adamantly that he does not want leads on his head. Patient statement was witness by nursing staff.

## 2021-06-06 NOTE — ED Notes (Signed)
The pt is alert and oriented x 4  hes determined to go home today

## 2021-06-06 NOTE — Progress Notes (Addendum)
Neurology Progress Note   S:// Seen and examined. Offers no complaints Was hooked up to overnight EEG-refused EEG and took the leads off.  Would not let the technologist reapply the leads.  O:// Current vital signs: BP (!) 173/99    Pulse 69    Temp 98.3 F (36.8 C)    Resp 17    SpO2 97%  Vital signs in last 24 hours: Temp:  [98.2 F (36.8 C)-98.3 F (36.8 C)] 98.3 F (36.8 C) (12/18 0602) Pulse Rate:  [56-100] 69 (12/18 0530) Resp:  [10-24] 17 (12/18 0530) BP: (128-176)/(63-99) 173/99 (12/18 0530) SpO2:  [93 %-100 %] 97 % (12/18 0530) General: Awake alert in no distress HEENT: Normocephalic/atraumatic Lungs: Clear Abdomen nondistended nontender Cardiovascular: Regular rate rhythm Neurological exam Awake alert oriented x3 Is able to follow commands Has mild word finding difficulty on long sentences Repetition is intact Cranial nerves II to XII intact Motor exam with no drift Sensory exam intact Coordination with no dysmetria   Medications  Current Facility-Administered Medications:    0.9 %  sodium chloride infusion, , Intravenous, Continuous, Albertine Grates, MD, Last Rate: 75 mL/hr at 06/05/21 1123, New Bag at 06/05/21 1123   acetaminophen (TYLENOL) tablet 650 mg, 650 mg, Oral, Q6H PRN **OR** acetaminophen (TYLENOL) suppository 650 mg, 650 mg, Rectal, Q6H PRN, Opyd, Lavone Neri, MD   atorvastatin (LIPITOR) tablet 20 mg, 20 mg, Oral, Daily, Opyd, Timothy S, MD, 20 mg at 06/05/21 0952   enoxaparin (LOVENOX) injection 40 mg, 40 mg, Subcutaneous, Q24H, Opyd, Timothy S, MD, 40 mg at 06/05/21 0952   insulin aspart (novoLOG) injection 0-5 Units, 0-5 Units, Subcutaneous, QHS, Opyd, Timothy S, MD, 2 Units at 06/05/21 2207   insulin aspart (novoLOG) injection 0-9 Units, 0-9 Units, Subcutaneous, TID WC, Opyd, Lavone Neri, MD, 1 Units at 06/05/21 1651   levETIRAcetam (KEPPRA) tablet 500 mg, 500 mg, Oral, BID, Opyd, Timothy S, MD, 500 mg at 06/05/21 2205   metoprolol succinate (TOPROL-XL)  24 hr tablet 50 mg, 50 mg, Oral, Daily, Opyd, Timothy S, MD, 50 mg at 06/05/21 0952   pantoprazole (PROTONIX) EC tablet 40 mg, 40 mg, Oral, Daily, Opyd, Lavone Neri, MD, 40 mg at 06/05/21 4496   senna-docusate (Senokot-S) tablet 1 tablet, 1 tablet, Oral, QHS PRN, Opyd, Lavone Neri, MD   sodium chloride flush (NS) 0.9 % injection 3 mL, 3 mL, Intravenous, Q12H, Opyd, Timothy S, MD, 3 mL at 06/05/21 2107   vitamin B-12 (CYANOCOBALAMIN) tablet 1,000 mcg, 1,000 mcg, Oral, Daily, Albertine Grates, MD, 1,000 mcg at 06/05/21 1645  Current Outpatient Medications:    allopurinol (ZYLOPRIM) 300 MG tablet, Take 300 mg by mouth at bedtime., Disp: , Rfl:    amLODipine (NORVASC) 5 MG tablet, Take 5 mg by mouth daily., Disp: , Rfl:    atorvastatin (LIPITOR) 40 MG tablet, Take 20 mg by mouth daily., Disp: , Rfl:    busPIRone (BUSPAR) 15 MG tablet, Take 15 mg by mouth 3 (three) times daily., Disp: , Rfl:    DULoxetine (CYMBALTA) 20 MG capsule, Take 40 mg by mouth daily as needed (pain)., Disp: , Rfl:    furosemide (LASIX) 20 MG tablet, Take 20 mg by mouth daily as needed (swelling)., Disp: , Rfl:    gabapentin (NEURONTIN) 600 MG tablet, Take 600 mg by mouth 3 (three) times daily., Disp: , Rfl:    glimepiride (AMARYL) 2 MG tablet, Take 2 mg by mouth daily., Disp: , Rfl:    lisinopril (ZESTRIL) 20 MG tablet,  Take 20 mg by mouth daily., Disp: , Rfl:    metFORMIN (GLUCOPHAGE) 500 MG tablet, Take 1,000 mg by mouth 2 (two) times daily with a meal., Disp: , Rfl:    methocarbamol (ROBAXIN) 500 MG tablet, Take 500 mg by mouth 3 (three) times daily., Disp: , Rfl:    metoprolol succinate (TOPROL-XL) 50 MG 24 hr tablet, Take 50 mg by mouth daily., Disp: , Rfl:    Multiple Vitamin (MULTIVITAMIN WITH MINERALS) TABS tablet, Take 1 tablet by mouth daily., Disp: , Rfl:    omeprazole (PRILOSEC) 40 MG capsule, Take 40 mg by mouth daily., Disp: , Rfl:    tamsulosin (FLOMAX) 0.4 MG CAPS capsule, Take 0.4 mg by mouth daily., Disp: , Rfl:     metoprolol succinate (TOPROL-XL) 100 MG 24 hr tablet, TAKE 1 TABLET EVERY DAY ( DOSE INCREASE ) (Patient not taking: Reported on 06/05/2021), Disp: , Rfl:  Labs CBC    Component Value Date/Time   WBC 8.1 06/05/2021 0341   RBC 4.30 06/05/2021 0341   HGB 12.9 (L) 06/05/2021 1140   HCT 38.0 (L) 06/05/2021 1140   PLT 200 06/05/2021 0341   MCV 89.8 06/05/2021 0341   MCH 29.1 06/05/2021 0341   MCHC 32.4 06/05/2021 0341   RDW 14.6 06/05/2021 0341   LYMPHSABS 2.8 06/04/2021 1858   MONOABS 0.7 06/04/2021 1858   EOSABS 0.1 06/04/2021 1858   BASOSABS 0.1 06/04/2021 1858    CMP     Component Value Date/Time   NA 137 06/06/2021 0308   K 3.8 06/06/2021 0308   CL 106 06/06/2021 0308   CO2 23 06/06/2021 0308   GLUCOSE 166 (H) 06/06/2021 0308   BUN 7 (L) 06/06/2021 0308   CREATININE 1.01 06/06/2021 0308   CALCIUM 8.3 (L) 06/06/2021 0308   PROT 6.9 06/04/2021 1858   ALBUMIN 3.6 06/04/2021 1858   AST 15 06/04/2021 1858   ALT 11 06/04/2021 1858   ALKPHOS 92 06/04/2021 1858   BILITOT 0.5 06/04/2021 1858   GFRNONAA >60 06/06/2021 0308   Spot EEG 06/05/2021-no seizures  Imaging I have reviewed images in epic and the results pertinent to this consultation are: MRI brain negative for acute stroke  Assessment: 86 year old man status post gamma knife resection of left frontal AVM brought in for concerns for aphasia. MRI brain was negative for stroke Concern for seizure Started on Keppra Exam seems to be coming towards baseline-although no family to corroborate his baseline at bedside. Refused EEG monitoring further. Spot EEG with no seizures. No further inpatient work-up   New onset seizure in the setting of left frontal AVM lesion status post gamma knife  Recommendations: Maintain seizure precautions Continue gabapentin Continue Keppra Outpatient neurology follow-up in 4 to 6 weeks Plan discussed with Dr. Randol Kern over the phone. Please call neurology with questions as  needed    -- Milon Dikes, MD Neurologist [facility] Pager: [phone]   Addendum For what ever part of EEG allowed, no seizures seen although study was technically difficult.  Please see the official EEG report in the chart.   -- Milon Dikes, MD Neurologist [facility] Pager: [phone]

## 2021-06-06 NOTE — ED Notes (Signed)
The pt  pulled all the wires on head off snd would not let the tech replace it  he repeated again that he ws going home today test abandoned .

## 2021-06-06 NOTE — Procedures (Signed)
Patient Name: Johnny Meyer  MRN: [id number]  Epilepsy Attending: Charlsie Quest  Referring Physician/Provider: Dr Milon Dikes Duration: 06/06/2021 5035 to 06/06/2021 0436   Patient history: 86 yo M with hx AVM s/p gamma knife, HTN, DM p/w aphasia. EEG to evaluate for seizure   Level of alertness: Awake, asleep   AEDs during EEG study: LEV   Technical aspects: This EEG study was done with scalp electrodes positioned according to the 10-20 International system of electrode placement. Electrical activity was acquired at a sampling rate of 500Hz  and reviewed with a high frequency filter of 70Hz  and a low frequency filter of 1Hz . EEG data were recorded continuously and digitally stored.    Description: The posterior dominant rhythm consists of 8 Hz activity of moderate voltage (25-35 uV) seen predominantly in posterior head regions, symmetric and reactive to eye opening and eye closing.  Hyperventilation and photic stimulation were not performed.      EEG was technically difficult due to significant myogenic artifact and EKG artifact. Patient declined study and had to discontinued after 0436.    IMPRESSION: This technically difficult study is within normal limits. No seizures or epileptiform discharges were seen throughout the recording.   Sharetta Ricchio 

## 2021-06-06 NOTE — Progress Notes (Signed)
LTM EEG hooked up and running - no initial skin breakdown - push button tested - neuro notified. [facility] monitoring.  

## 2021-07-05 NOTE — Progress Notes (Signed)
ok 

## 2024-03-20 DEATH — deceased
